# Patient Record
Sex: Female | Born: 1940
Health system: Southern US, Community
[De-identification: ages and names within clinical notes are randomized; demographics above are authoritative.]

## PROBLEM LIST (undated history)

## (undated) DIAGNOSIS — C4491 Basal cell carcinoma of skin, unspecified: Secondary | ICD-10-CM

## (undated) DIAGNOSIS — H269 Unspecified cataract: Secondary | ICD-10-CM

## (undated) DIAGNOSIS — E785 Hyperlipidemia, unspecified: Secondary | ICD-10-CM

## (undated) DIAGNOSIS — S32020A Wedge compression fracture of second lumbar vertebra, initial encounter for closed fracture: Secondary | ICD-10-CM

## (undated) DIAGNOSIS — E039 Hypothyroidism, unspecified: Secondary | ICD-10-CM

## (undated) DIAGNOSIS — E739 Lactose intolerance, unspecified: Secondary | ICD-10-CM

## (undated) DIAGNOSIS — M8580 Other specified disorders of bone density and structure, unspecified site: Secondary | ICD-10-CM

## (undated) HISTORY — DX: Wedge compression fracture of second lumbar vertebra, initial encounter for closed fracture: S32.020A

## (undated) HISTORY — DX: Other specified disorders of bone density and structure, unspecified site: M85.80

## (undated) HISTORY — DX: Hypothyroidism, unspecified: E03.9

## (undated) HISTORY — DX: Hyperlipidemia, unspecified: E78.5

## (undated) HISTORY — DX: Lactose intolerance, unspecified: E73.9

## (undated) HISTORY — DX: Basal cell carcinoma of skin, unspecified: C44.91

## (undated) HISTORY — DX: Unspecified cataract: H26.9

---

## 1986-04-07 HISTORY — PX: BACK SURGERY: SHX140

## 1999-01-28 ENCOUNTER — Encounter: Admission: RE | Admit: 1999-01-28 | Discharge: 1999-01-28 | Payer: Self-pay | Admitting: Obstetrics and Gynecology

## 1999-01-28 ENCOUNTER — Encounter: Payer: Self-pay | Admitting: Obstetrics and Gynecology

## 2000-04-13 ENCOUNTER — Encounter: Admission: RE | Admit: 2000-04-13 | Discharge: 2000-04-13 | Payer: Self-pay | Admitting: Obstetrics and Gynecology

## 2000-04-13 ENCOUNTER — Encounter: Payer: Self-pay | Admitting: Obstetrics and Gynecology

## 2000-04-24 ENCOUNTER — Emergency Department (HOSPITAL_COMMUNITY): Admission: EM | Admit: 2000-04-24 | Discharge: 2000-04-24 | Payer: Self-pay | Admitting: Emergency Medicine

## 2000-04-25 ENCOUNTER — Encounter: Payer: Self-pay | Admitting: Emergency Medicine

## 2000-05-08 ENCOUNTER — Observation Stay (HOSPITAL_COMMUNITY): Admission: RE | Admit: 2000-05-08 | Discharge: 2000-05-09 | Payer: Self-pay | Admitting: *Deleted

## 2000-05-08 ENCOUNTER — Encounter (INDEPENDENT_AMBULATORY_CARE_PROVIDER_SITE_OTHER): Payer: Self-pay | Admitting: Specialist

## 2000-05-08 ENCOUNTER — Encounter: Payer: Self-pay | Admitting: *Deleted

## 2001-07-07 ENCOUNTER — Encounter: Admission: RE | Admit: 2001-07-07 | Discharge: 2001-07-07 | Payer: Self-pay | Admitting: Obstetrics and Gynecology

## 2001-07-07 ENCOUNTER — Encounter: Payer: Self-pay | Admitting: Obstetrics and Gynecology

## 2002-07-21 ENCOUNTER — Encounter: Admission: RE | Admit: 2002-07-21 | Discharge: 2002-07-21 | Payer: Self-pay | Admitting: Obstetrics and Gynecology

## 2002-07-21 ENCOUNTER — Encounter: Payer: Self-pay | Admitting: Obstetrics and Gynecology

## 2003-07-26 ENCOUNTER — Encounter: Admission: RE | Admit: 2003-07-26 | Discharge: 2003-07-26 | Payer: Self-pay | Admitting: Obstetrics and Gynecology

## 2004-09-06 ENCOUNTER — Encounter: Admission: RE | Admit: 2004-09-06 | Discharge: 2004-09-06 | Payer: Self-pay | Admitting: Obstetrics and Gynecology

## 2006-01-23 ENCOUNTER — Encounter: Admission: RE | Admit: 2006-01-23 | Discharge: 2006-01-23 | Payer: Self-pay | Admitting: Obstetrics and Gynecology

## 2006-12-02 LAB — CONVERTED CEMR LAB: Pap Smear: NORMAL

## 2007-02-24 ENCOUNTER — Encounter: Admission: RE | Admit: 2007-02-24 | Discharge: 2007-02-24 | Payer: Self-pay | Admitting: Obstetrics and Gynecology

## 2007-12-09 ENCOUNTER — Ambulatory Visit: Payer: Self-pay | Admitting: Family Medicine

## 2007-12-09 DIAGNOSIS — E039 Hypothyroidism, unspecified: Secondary | ICD-10-CM | POA: Insufficient documentation

## 2007-12-09 DIAGNOSIS — R143 Flatulence: Secondary | ICD-10-CM

## 2007-12-09 DIAGNOSIS — R142 Eructation: Secondary | ICD-10-CM

## 2007-12-09 DIAGNOSIS — R141 Gas pain: Secondary | ICD-10-CM | POA: Insufficient documentation

## 2007-12-09 DIAGNOSIS — M8580 Other specified disorders of bone density and structure, unspecified site: Secondary | ICD-10-CM | POA: Insufficient documentation

## 2007-12-14 LAB — CONVERTED CEMR LAB
ALT: 16 units/L (ref 0–35)
BUN: 14 mg/dL (ref 6–23)
Basophils Relative: 1.3 % (ref 0.0–3.0)
Bilirubin, Direct: 0.1 mg/dL (ref 0.0–0.3)
Cholesterol: 194 mg/dL (ref 0–200)
Creatinine, Ser: 0.8 mg/dL (ref 0.4–1.2)
Eosinophils Absolute: 0.2 10*3/uL (ref 0.0–0.7)
Eosinophils Relative: 3.5 % (ref 0.0–5.0)
HCT: 41.5 % (ref 36.0–46.0)
Hemoglobin: 14.1 g/dL (ref 12.0–15.0)
Lymphocytes Relative: 23.6 % (ref 12.0–46.0)
MCHC: 34.1 g/dL (ref 30.0–36.0)
MCV: 88.9 fL (ref 78.0–100.0)
Potassium: 4.2 meq/L (ref 3.5–5.1)
RBC: 4.67 M/uL (ref 3.87–5.11)
Sodium: 142 meq/L (ref 135–145)
TSH: 1.55 microintl units/mL (ref 0.35–5.50)
Total CHOL/HDL Ratio: 6.2
Total Protein: 7.9 g/dL (ref 6.0–8.3)
Triglycerides: 91 mg/dL (ref 0–149)
VLDL: 18 mg/dL (ref 0–40)
WBC: 6.1 10*3/uL (ref 4.5–10.5)

## 2007-12-30 ENCOUNTER — Ambulatory Visit: Payer: Self-pay | Admitting: Family Medicine

## 2008-01-14 ENCOUNTER — Ambulatory Visit: Payer: Self-pay | Admitting: Internal Medicine

## 2008-01-28 ENCOUNTER — Ambulatory Visit: Payer: Self-pay | Admitting: Internal Medicine

## 2008-01-28 LAB — HM COLONOSCOPY

## 2008-02-25 ENCOUNTER — Encounter: Admission: RE | Admit: 2008-02-25 | Discharge: 2008-02-25 | Payer: Self-pay | Admitting: Family Medicine

## 2008-02-25 ENCOUNTER — Encounter: Payer: Self-pay | Admitting: Family Medicine

## 2008-03-09 ENCOUNTER — Ambulatory Visit: Payer: Self-pay | Admitting: Family Medicine

## 2008-03-09 DIAGNOSIS — E785 Hyperlipidemia, unspecified: Secondary | ICD-10-CM | POA: Insufficient documentation

## 2008-03-10 LAB — CONVERTED CEMR LAB
AST: 22 units/L (ref 0–37)
Cholesterol: 198 mg/dL (ref 0–200)
HDL: 39.9 mg/dL (ref >39.0)
LDL Cholesterol: 145 mg/dL — ABNORMAL HIGH (ref 0–99)
VLDL: 14 mg/dL (ref 0–40)

## 2009-02-27 IMAGING — MG MM SCREEN MAMMOGRAM BILATERAL
4 series · 4 of 4 positions shown · non-contrast
Comparison: none

DG SCREEN MAMMOGRAM BILATERAL
Bilateral CC and MLO view(s) were taken.

DIGITAL SCREENING MAMMOGRAM WITH CAD:
There are scattered fibroglandular densities.  No masses or malignant type calcifications are 
identified.  Compared with prior studies.

[R CC]
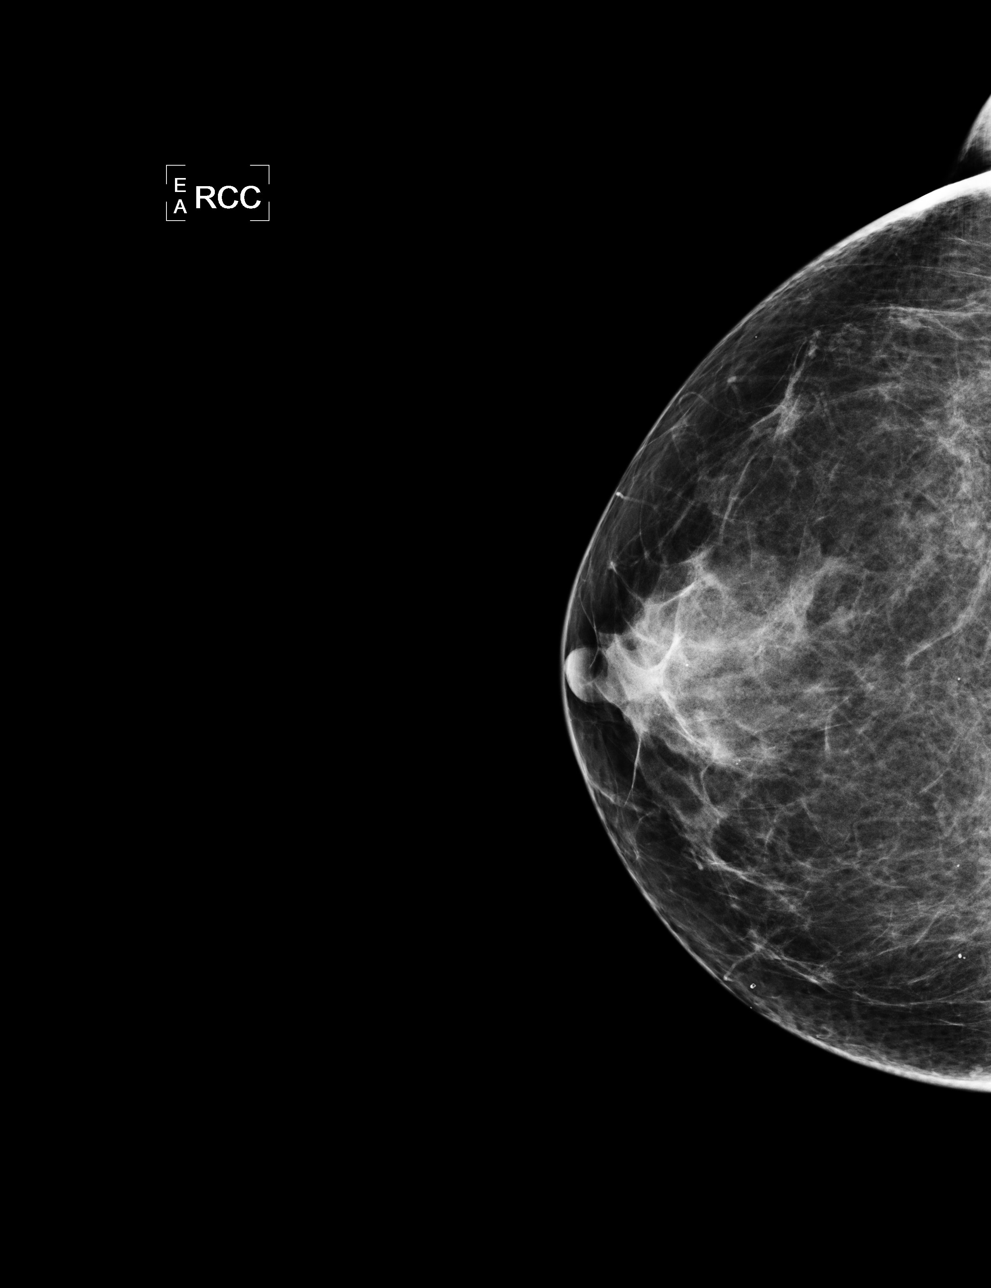

[L CC]
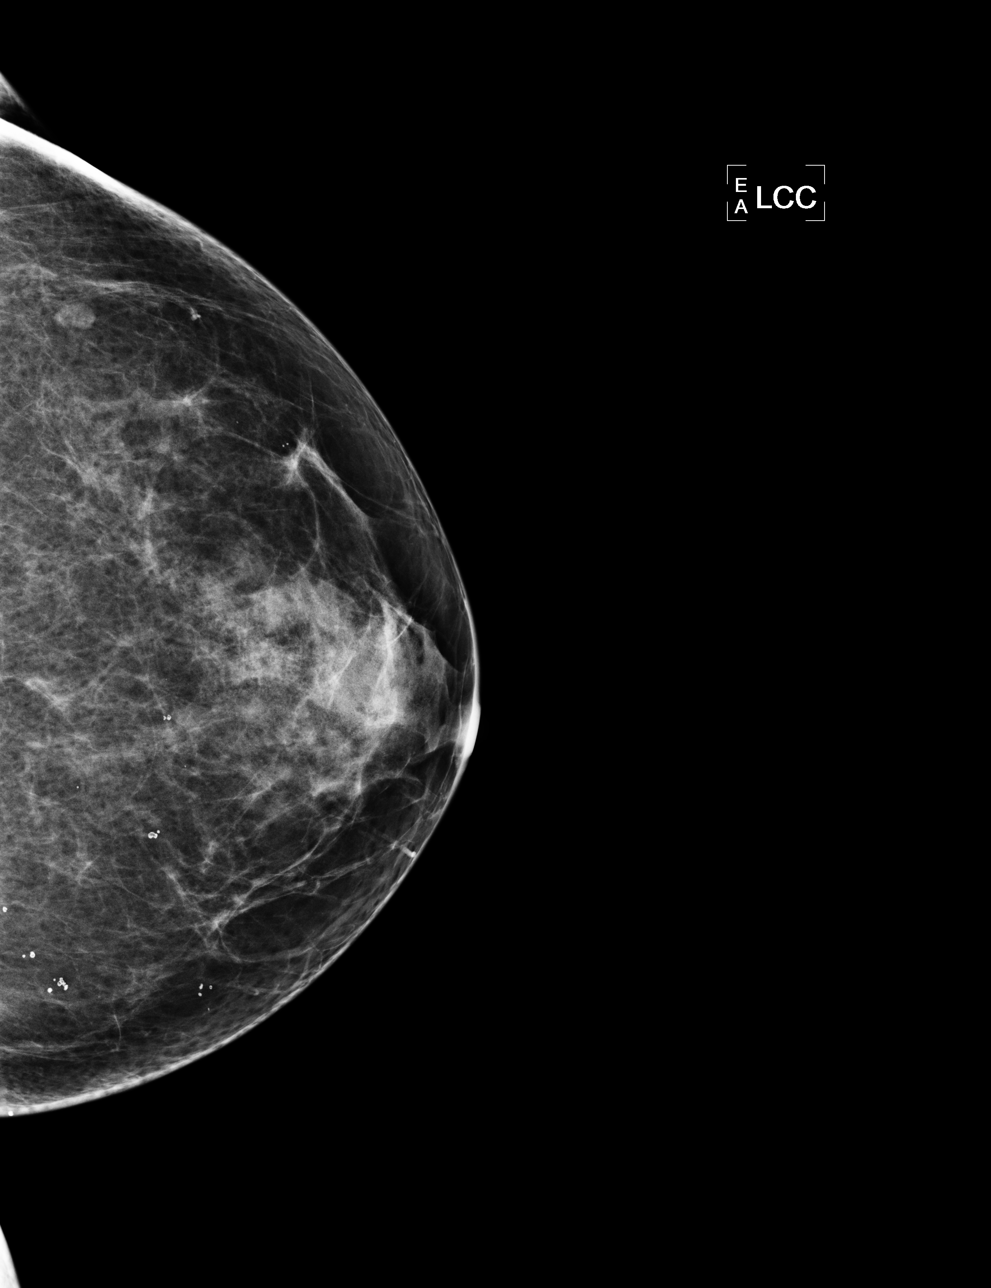

[L MLO]
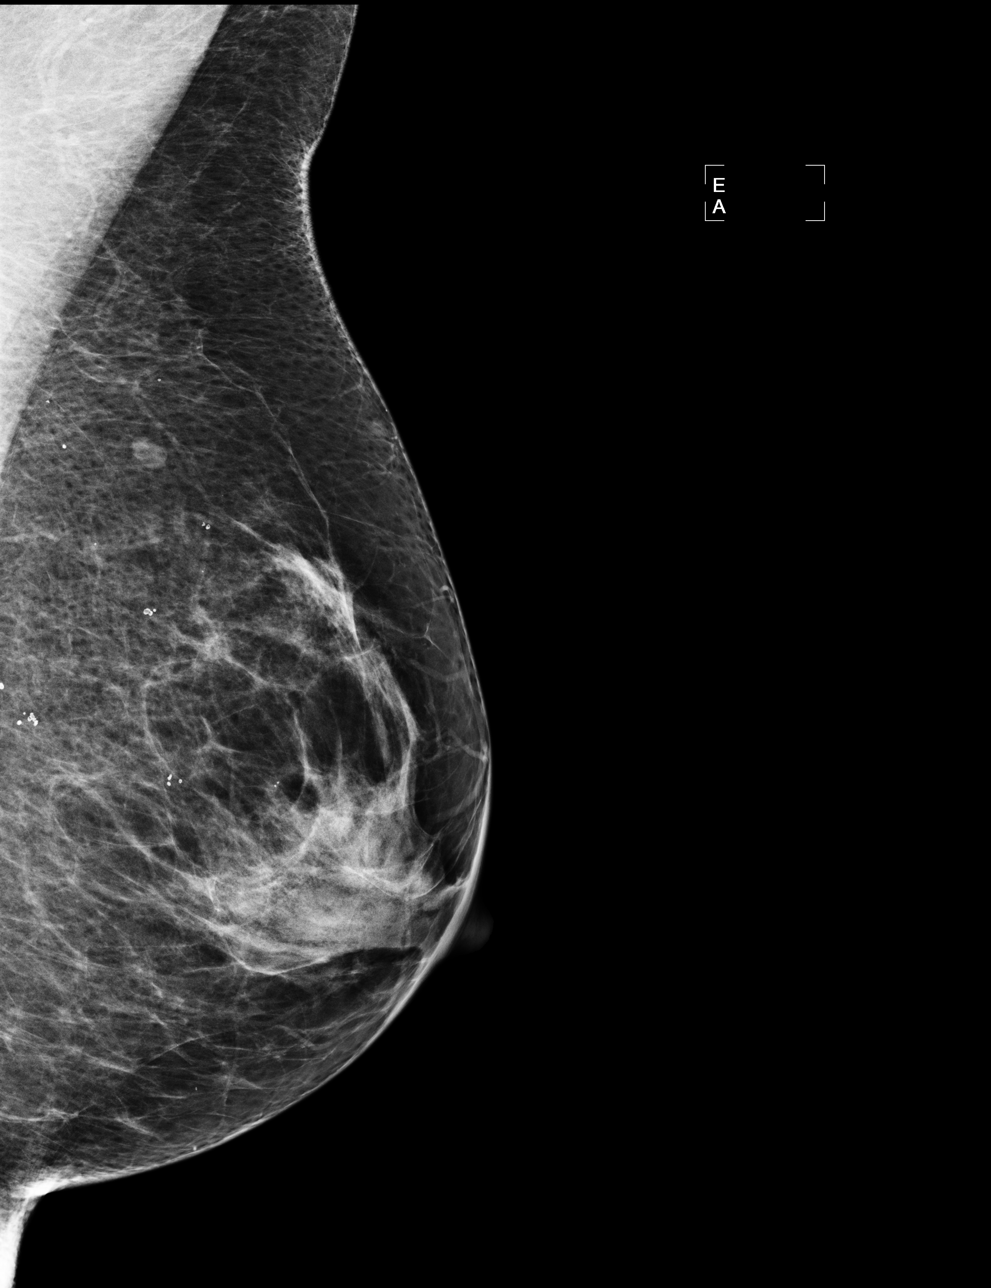

[R MLO]
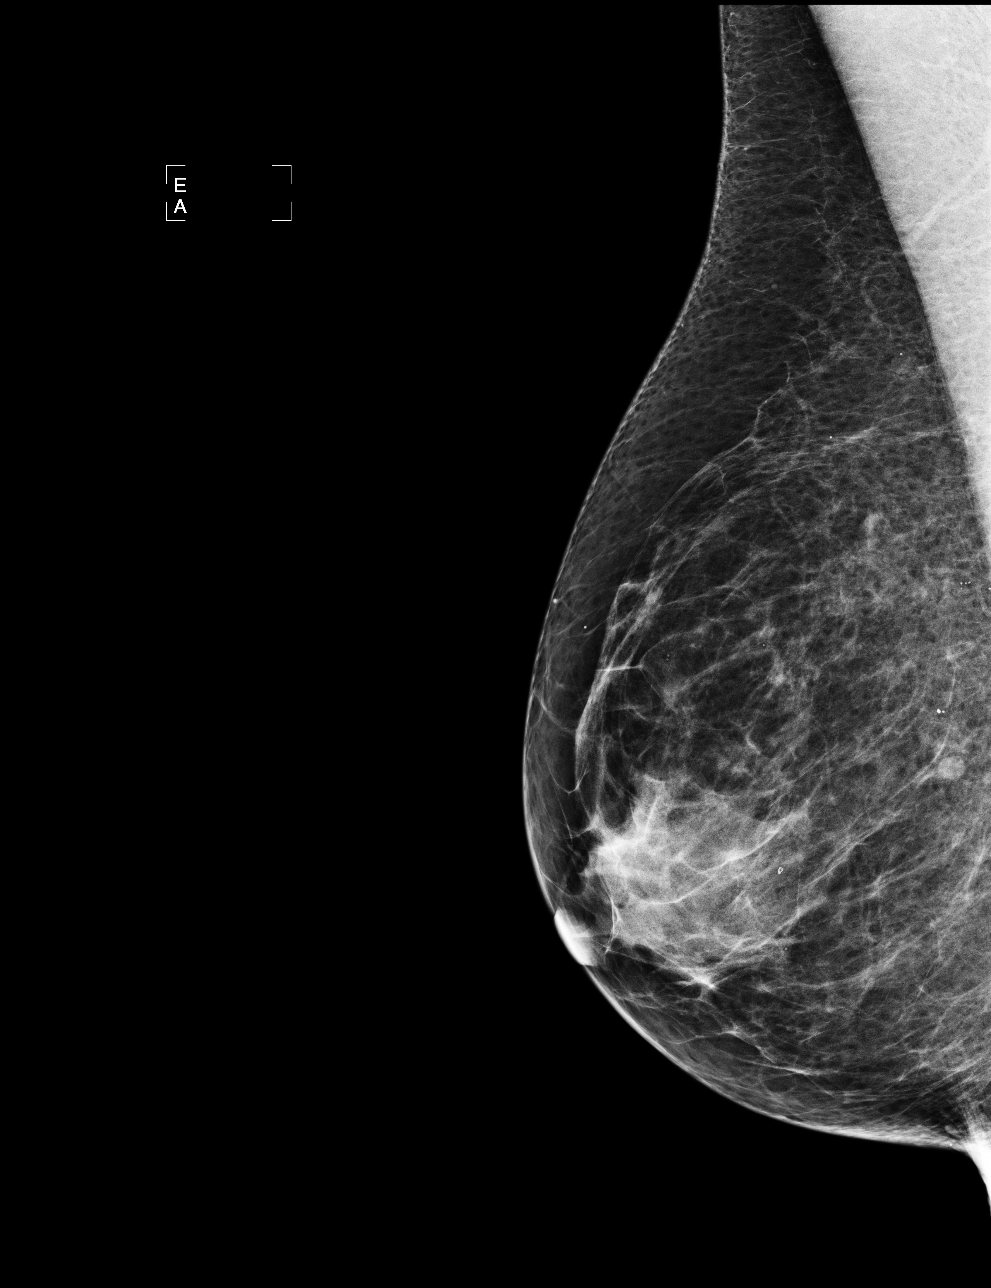

[4 of 4 positions shown; findings below may reference images not displayed]

IMPRESSION: No specific mammographic evidence of malignancy.  Next screening mammogram is recommended in one 
year.

ASSESSMENT: Negative - BI-RADS 1

Screening mammogram in 1 year.
ANALYZED BY COMPUTER AIDED DETECTION. , THIS PROCEDURE WAS A DIGITAL MAMMOGRAM.

## 2009-03-08 ENCOUNTER — Encounter: Admission: RE | Admit: 2009-03-08 | Discharge: 2009-03-08 | Payer: Self-pay | Admitting: Family Medicine

## 2009-03-12 ENCOUNTER — Encounter (INDEPENDENT_AMBULATORY_CARE_PROVIDER_SITE_OTHER): Payer: Self-pay | Admitting: *Deleted

## 2009-04-12 ENCOUNTER — Telehealth: Payer: Self-pay | Admitting: Family Medicine

## 2009-07-18 ENCOUNTER — Ambulatory Visit: Payer: Self-pay | Admitting: Family Medicine

## 2009-07-18 ENCOUNTER — Other Ambulatory Visit: Admission: RE | Admit: 2009-07-18 | Discharge: 2009-07-18 | Payer: Self-pay | Admitting: Family Medicine

## 2009-07-18 DIAGNOSIS — L708 Other acne: Secondary | ICD-10-CM | POA: Insufficient documentation

## 2009-07-18 LAB — HM PAP SMEAR

## 2009-07-19 LAB — CONVERTED CEMR LAB: Vit D, 25-Hydroxy: 40 ng/mL (ref 30–89)

## 2009-07-20 ENCOUNTER — Telehealth: Payer: Self-pay | Admitting: Family Medicine

## 2009-07-24 LAB — CONVERTED CEMR LAB
Albumin: 4.1 g/dL (ref 3.5–5.2)
CO2: 30 meq/L (ref 19–32)
Chloride: 100 meq/L (ref 96–112)
Cholesterol: 235 mg/dL — ABNORMAL HIGH (ref 0–200)
Direct LDL: 173.3 mg/dL
HDL: 46.8 mg/dL (ref >39.00)
Sodium: 139 meq/L (ref 135–145)
TSH: 1.54 microintl units/mL (ref 0.35–5.50)
Total CHOL/HDL Ratio: 5
Triglycerides: 106 mg/dL (ref 0.0–149.0)

## 2009-07-26 ENCOUNTER — Encounter (INDEPENDENT_AMBULATORY_CARE_PROVIDER_SITE_OTHER): Payer: Self-pay | Admitting: *Deleted

## 2009-10-19 ENCOUNTER — Ambulatory Visit: Payer: Self-pay | Admitting: Family Medicine

## 2009-10-21 LAB — CONVERTED CEMR LAB
AST: 22 units/L (ref 0–37)
Direct LDL: 137.6 mg/dL
VLDL: 22.6 mg/dL (ref 0.0–40.0)

## 2009-10-26 ENCOUNTER — Ambulatory Visit: Payer: Self-pay | Admitting: Family Medicine

## 2010-04-26 ENCOUNTER — Encounter
Admission: RE | Admit: 2010-04-26 | Discharge: 2010-04-26 | Payer: Self-pay | Source: Home / Self Care | Attending: Family Medicine | Admitting: Family Medicine

## 2010-04-26 LAB — HM MAMMOGRAPHY: HM Mammogram: NORMAL

## 2010-04-30 ENCOUNTER — Encounter (INDEPENDENT_AMBULATORY_CARE_PROVIDER_SITE_OTHER): Payer: Self-pay | Admitting: *Deleted

## 2010-04-30 ENCOUNTER — Encounter: Payer: Self-pay | Admitting: Family Medicine

## 2010-05-09 NOTE — Progress Notes (Signed)
Summary: Rx Levothroid  Phone Note Outgoing Call Call back at [phone]   Call placed by: Linde Gillis CMA [facility]),  April 12, 2009 11:16 AM Call placed to: Pharmacy Summary of Call: Called in a refill for Levothroid , #90 with no additional refills.  Advised pharmacist that patient would need to schedule an appointment and have lab work done before any additional refills will be authorized. Initial call taken by: Linde Gillis CMA [facility]),  April 12, 2009 11:17 AM

## 2010-05-09 NOTE — Miscellaneous (Signed)
Summary: Mammogram to flowsheet  Clinical Lists Changes  Observations: Added new observation of MAMMO DUE: 05/2011 (04/30/2010 9:33) Added new observation of MAMMOGRAM: normal (04/26/2010 9:33)      Preventive Care Screening  Mammogram:    Date:  04/26/2010    Next Due:  05/2011    Results:  normal

## 2010-05-09 NOTE — Assessment & Plan Note (Signed)
Summary: PAP SMEAR AND CPX/CLE   Vital Signs:  Patient profile:   70 year old female Height:      63.25 inches Weight:      150 pounds BMI:     26.46 Temp:     98.2 degrees F oral Pulse rate:   80 / minute Pulse rhythm:   regular BP sitting:   112 / 70  (left arm) Cuff size:   regular  Vitals Entered By: Lewanda Rife LPN (July 18, 2009 10:04 AM) CC: check up    History of Present Illness: here for f/u of chronic health problems and to review health mt list  has had a good year no complaints  is taking good care of herself   is interested in going to a gym  perhaps the silver sneakers -- but cannot afford the gas to go  now is walking at least a mile at lunch    wt is up 10 lb is trying the best to loose wt and eat healthy watching carbs and cutting down on portions -- and more fiber   bp is 112/ 70  hypothyroid - due for check  has gained some wt - but feels about the same   is working 8 hours per day   lipids - due diet - is better- got away from fried foods   ostopenia 11/09  is taking her ca and vit  d- has helped her sleep   colonosc nl 09   pap 08 -- has been 3 years no gyn problems   mam 12/10  self exam - no lumps   Td 09   never had shingles vaccine andnot interested in  declines flu and pneumovax   has black head on back cannot get out - bothers her  some SKs     Allergies: 1)  ! 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 Eyes:  Denies blurring and eye irritation. CV:  Denies chest pain or discomfort, lightheadness, palpitations, and shortness of breath with exertion. Resp:  Denies cough, shortness of breath, and wheezing. GI:  Denies abdominal pain, bloody stools, change in bowel habits, indigestion, and nausea. GU:  Denies urinary frequency. MS:  Denies joint pain, joint redness, joint swelling, muscle aches, and muscle weakness. Derm:  Denies itching, lesion(s), poor wound healing, and rash. Neuro:  Denies numbness, tingling, and weakness. Psych:  mood is ok . Endo:  Denies cold intolerance, excessive thirst, excessive urination, and heat intolerance. Heme:  Denies abnormal bruising and bleeding.  Physical Exam  General:  Well-developed,well-nourished,in no acute distress; alert,appropriate and cooperative throughout examination Head:  normocephalic, atraumatic, and no abnormalities observed.   Eyes:  vision grossly intact, pupils equal, pupils round, and pupils reactive to light.  no conjunctival pallor, injection or icterus  Ears:  R ear normal and L ear normal.   Nose:  no nasal  discharge.   Mouth:  pharynx pink and moist.   Neck:  supple with full rom and no masses or thyromegally, no JVD or carotid bruit  Chest Wall:  No deformities, masses, or tenderness noted. Breasts:  No mass, nodules, thickening, tenderness, bulging, retraction, inflamation, nipple discharge or skin changes noted.   Lungs:  Normal respiratory effort, chest expands symmetrically. Lungs are clear to auscultation, no crackles or wheezes. Heart:  Normal rate and regular rhythm. S1 and S2 normal without gallop, murmur, click, rub or other extra sounds. Abdomen:  Bowel sounds positive,abdomen soft and non-tender without masses, organomegaly or hernias noted. no renal bruits  Genitalia:  Normal introitus for age, no external lesions, no  vaginal discharge, mucosa pink and moist, no vaginal or cervical lesions, no vaginal atrophy, no friaility or hemorrhage, normal uterus size and position, no adnexal masses or tenderness Msk:  No deformity or scoliosis noted of thoracic or lumbar spine.  no acute joint changes  Pulses:  R and L carotid,radial,femoral,dorsalis pedis and posterior tibial pulses are full and equal bilaterally Extremities:  No clubbing, cyanosis, edema, or deformity noted with normal full range of motion of all joints.   Neurologic:  sensation intact to light touch, gait normal, and DTRs symmetrical and normal.   Skin:  Intact without suspicious lesions or rashes comedone R back .5 cm / dark  area pierced sterilly and seb material expressed with relief  some SKs and lentigos  Cervical Nodes:  No lymphadenopathy noted Axillary Nodes:  No palpable lymphadenopathy Inguinal Nodes:  No significant adenopathy Psych:  normal affect, talkative and pleasant    Impression & Recommendations:  Problem # 1:  PURE HYPERCHOLESTEROLEMIA (ICD-272.0) Assessment Unchanged  check lipids  disc low sat fat diet  consider statin if worse on good diet - disc this , pt wants to avoid if poss Orders: Venipuncture (04540) TLB-Lipid Panel (80061-LIPID) TLB-Renal Function Panel (80069-RENAL) TLB-ALT (SGPT) (84460-ALT) TLB-AST (SGOT) (84450-SGOT)  Labs Reviewed: SGOT: 22 (03/09/2008)   SGPT: 16 (03/09/2008)   HDL:39.9 (03/09/2008), 31.4 (12/09/2007)  LDL:145 (03/09/2008), 144 (12/09/2007)  Chol:198 (03/09/2008), 194 (12/09/2007)  Trig:68 (03/09/2008), 91 (12/09/2007)  Problem # 2:  OSTEOPENIA (ICD-733.90) Assessment: Unchanged dexa within 2 y  rev ca and vit D check D level disc inc wt bearing exercise  The following medications were removed from the medication list:    Calcium 600 Mg Tabs (Calcium) .Marland Kitchen... Take one by mouth twice a day Her updated medication list for this problem includes:    Vitamin D 1000 Unit Tabs  (Cholecalciferol) .Marland Kitchen... Daily    Calcium-vitamin D3 600-200 Mg-unit Tabs (Calcium carbonate-vitamin d) .Marland Kitchen... Take one tablet by mouth twice a day  Orders: Venipuncture (98119) TLB-Lipid Panel (80061-LIPID) TLB-Renal Function Panel (80069-RENAL) TLB-ALT (SGPT) (84460-ALT) TLB-AST (SGOT) (84450-SGOT) T-Vitamin D (25-Hydroxy) (14782-95621)  Problem # 3:  HYPOTHYROIDISM (ICD-244.9) Assessment: Unchanged  with no clinical changes but wt gain tsh and update  Her updated medication list for this problem includes:    Synthroid 75 Mcg Tabs (Levothyroxine sodium) .Marland Kitchen... Take one by mouth daily  Orders: Venipuncture (30865) TLB-Lipid Panel (80061-LIPID) TLB-Renal Function Panel (80069-RENAL) TLB-ALT (SGPT) (84460-ALT) TLB-AST (SGOT) (84450-SGOT) TLB-TSH (Thyroid Stimulating Hormone) (84443-TSH)  Labs Reviewed: TSH: 1.55 (12/09/2007)    Chol: 198 (03/09/2008)   HDL: 39.9 (03/09/2008)   LDL: 145 (03/09/2008)   TG: 68 (03/09/2008)  Problem # 4:  ROUTINE GYNECOLOGICAL EXAMINATION (ICD-V72.31) annual exam mam up to date   Problem # 5:  COMEDO (ICD-706.1) Assessment: New large comedo  on R back pierced sterilly and expressed with relief adv to update if pain or redness   Complete Medication List: 1)  Synthroid 75 Mcg Tabs (Levothyroxine sodium) .... Take one by mouth daily 2)  Vitamin E 400 Unit Caps (Vitamin e) .... Take one by mouth daily 3)  Gelatin 10 Grains  .... Take one by mouth twice a day 4)  Fish Oil 1200 Mg Caps (Omega-3 fatty acids) .... Daily 5)  Vitamin D 1000 Unit Tabs (Cholecalciferol) .... Daily 6)  Calcium-vitamin D3 600-200 Mg-unit Tabs (Calcium carbonate-vitamin d) .... Take one tablet by mouth twice a day  Patient Instructions: 1)  keep up the good work with healthy diet and exercise  2)  labs today including vit D level and cholesterol  3)  keep area on back clean and dry - let  me know if red or drainage  Prescriptions: SYNTHROID 75 MCG TABS (LEVOTHYROXINE  SODIUM) take one by mouth daily  #90 x 3   Entered and Authorized by:   Judith Part MD   Signed by:   Judith Part MD on 07/18/2009   Method used:   Print then Give to Patient   RxID:   [id number]   Current Allergies (reviewed today): ! DARVOCET   Preventive Care Screening  Contraindications of Treatment or Deferment of Test/Procedure:    Test/Procedure: FLU VAX    Reason for deferment: patient declined     Test/Procedure: Pneumovax vaccine    Reason for deferment: patient declined     pt declines zoster vaccine and flu and pneumovax

## 2010-05-09 NOTE — Letter (Signed)
Summary: Results Follow up Letter  [place] [facility]  [address] [place], [place] [address]   Phone: [phone]  Fax: [phone]    07/26/2009 MRN: [id number]    [facility] [address] [facility] [address] [place], [place]  [address]    Dear Valerie Ho,  The following are the results of your recent test(s):  Test         Result    Pap Smear:        Normal __X___  Not Normal _____ Comments: ______________________________________________________ Cholesterol: LDL(Bad cholesterol):         Your goal is less than:         HDL (Good cholesterol):       Your goal is more than: Comments:  ______________________________________________________ Mammogram:        Normal _____  Not Normal _____ Comments:  ___________________________________________________________________ Hemoccult:        Normal _____  Not normal _______ Comments:    _____________________________________________________________________ Other Tests:    We routinely do not discuss normal results over the telephone.  If you desire a copy of the results, or you have any questions about this information we can discuss them at your next office visit.   Sincerely,    Marne A. Milinda Antis, M.D.  MAT:lsf

## 2010-05-09 NOTE — Letter (Signed)
Summary: Results Follow up Letter  [name] [facility]  [address] [place], [place] [address]   Phone: [phone]  Fax: [phone]    04/30/2010 MRN: [id number]  [facility] [address] [facility] [address] [place], [place]  [address]  Dear Valerie Ho,  The following are the results of your recent test(s):  Test         Result    Pap Smear:        Normal _____  Not Normal _____ Comments: ______________________________________________________ Cholesterol: LDL(Bad cholesterol):         Your goal is less than:         HDL (Good cholesterol):       Your goal is more than: Comments:  ______________________________________________________ Mammogram:        Normal __X___  Not Normal _____ Comments: Repeat in 1 year  ___________________________________________________________________ Hemoccult:        Normal _____  Not normal _______ Comments:    _____________________________________________________________________ Other Tests:    We routinely do not discuss normal results over the telephone.  If you desire a copy of the results, or you have any questions about this information we can discuss them at your next office visit.   Sincerely,       Sharilyn Sites for Dr. Roxy Manns

## 2010-05-09 NOTE — Progress Notes (Signed)
Summary: Synthroid rx  Phone Note From Pharmacy Call back at [phone]   Caller: [facility] Call For: Dr Manessa Buley  Summary of Call: completed form and Synthroid rx take one by mouth daily #90 with 3 refills faxed to [phone]. form is at my desk if needed later.Lewanda Rife LPN  July 20, 2009 6:17 PM  Initial call taken by: Lewanda Rife LPN,  July 20, 2009 6:17 PM

## 2010-05-09 NOTE — Assessment & Plan Note (Signed)
Summary: FOLLOW UP AFTER LABS/RI   Vital Signs:  Patient profile:   70 year old female Height:      63.25 inches Weight:      143.75 pounds BMI:     25.35 Temp:     98 degrees F oral Pulse rate:   76 / minute Pulse rhythm:   regular BP sitting:   128 / 74  (left arm) Cuff size:   regular  Vitals Entered By: Lewanda Rife LPN (October 26, 2009 8:04 AM) CC: follow-up visit after labs   History of Present Illness: here for f/u of high lipids   wt is down 7 lb  chol is much improved with LDL coming down from 173 to 137 trig 113, and HDL 36  diet - is watching to not eat red meat no fried foods/ no egg yolks or shrimp - except occ fried fish   exercise has helped the most -- going to [employer] and doing zumba on M and TH   is really proud of herself   Allergies: 1)  ! 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 Eyes:  Denies blurring and eye irritation. CV:  Denies chest pain or discomfort, lightheadness, and palpitations. Resp:  Denies cough and shortness of breath. GI:  Denies abdominal pain, change in bowel habits, nausea, and vomiting. MS:  Denies muscle aches and cramps. Derm:  Denies itching, lesion(s), poor wound healing, and rash. Neuro:  Denies numbness and tingling. Heme:  Denies abnormal bruising and bleeding.  Physical Exam  General:  Well-developed,well-nourished,in  no acute distress; alert,appropriate and cooperative throughout examination wt loss noted Head:  normocephalic, atraumatic, and no abnormalities observed.   Mouth:  pharynx pink and moist.   Neck:  supple with full rom and no masses or thyromegally, no JVD or carotid bruit  Lungs:  Normal respiratory effort, chest expands symmetrically. Lungs are clear to auscultation, no crackles or wheezes. Heart:  Normal rate and regular rhythm. S1 and S2 normal without gallop, murmur, click, rub or other extra sounds. Pulses:  R and L carotid,radial,femoral,dorsalis pedis and posterior tibial pulses are full and equal bilaterally Extremities:  No clubbing, cyanosis, edema, or deformity noted with normal full range of motion of all joints.   Neurologic:  sensation intact to light touch, gait normal, and DTRs symmetrical and normal.   Skin:  Intact without suspicious lesions or rashes Cervical Nodes:  No lymphadenopathy noted Psych:  normal affect, talkative and pleasant    Impression & Recommendations:  Problem # 1:  PURE HYPERCHOLESTEROLEMIA (ICD-272.0) Assessment Improved  much improved with better diet and more exercise rev labs in detail goal LDL is 130 or below- will keep working on it  disc low sat fat diet in detail  Labs Reviewed: SGOT: 22 (10/19/2009)   SGPT: 15 (10/19/2009)   HDL:36.10 (10/19/2009), 46.80 (07/18/2009)  LDL:145 (03/09/2008), 144 (12/09/2007)  Chol:208 (10/19/2009),  235 (07/18/2009)  Trig:113.0 (10/19/2009), 106.0 (07/18/2009)  Complete Medication List: 1)  Synthroid 75 Mcg Tabs (Levothyroxine sodium) .... Take one by mouth daily 2)  Vitamin E 400 Unit Caps (Vitamin e) .... Take one by mouth daily 3)  Gelatin 10 Grains  .... Take two by mouth twice a day 4)  Fish Oil 1200 Mg Caps (Omega-3 fatty acids) .... Two capsules once daily 5)  Vitamin D 1000 Unit Tabs (Cholecalciferol) .... Daily 6)  Calcium-vitamin D3 600-200 Mg-unit Tabs (Calcium carbonate-vitamin d) .... Take  one tablet by mouth twice a day  Patient Instructions: 1)  cholesterol is much improved  2)  is almost at goal 3)  keep working on diet and exercise   Current Allergies (reviewed today): ! DARVOCET

## 2010-08-03 ENCOUNTER — Encounter: Payer: Self-pay | Admitting: Family Medicine

## 2010-08-19 ENCOUNTER — Telehealth (INDEPENDENT_AMBULATORY_CARE_PROVIDER_SITE_OTHER): Payer: MEDICARE | Admitting: Family Medicine

## 2010-08-19 DIAGNOSIS — M899 Disorder of bone, unspecified: Secondary | ICD-10-CM

## 2010-08-19 DIAGNOSIS — M949 Disorder of cartilage, unspecified: Secondary | ICD-10-CM

## 2010-08-19 DIAGNOSIS — E039 Hypothyroidism, unspecified: Secondary | ICD-10-CM

## 2010-08-19 DIAGNOSIS — E7800 Pure hypercholesterolemia, unspecified: Secondary | ICD-10-CM

## 2010-08-19 NOTE — Telephone Encounter (Signed)
Message copied by Roxy Manns on Mon Aug 19, 2010  9:03 PM ------      Message from: Melody Comas      Created: Mon Aug 19, 2010  8:54 AM      Regarding: Lab orders       Patient is coming in for cpx labs tomorrow. Please put orders in. Thanks.

## 2010-08-20 ENCOUNTER — Other Ambulatory Visit (INDEPENDENT_AMBULATORY_CARE_PROVIDER_SITE_OTHER): Payer: MEDICARE | Admitting: Family Medicine

## 2010-08-20 DIAGNOSIS — M949 Disorder of cartilage, unspecified: Secondary | ICD-10-CM

## 2010-08-20 DIAGNOSIS — M899 Disorder of bone, unspecified: Secondary | ICD-10-CM

## 2010-08-20 DIAGNOSIS — E7800 Pure hypercholesterolemia, unspecified: Secondary | ICD-10-CM

## 2010-08-20 LAB — LIPID PANEL
Cholesterol: 193 mg/dL (ref 0–200)
HDL: 38.2 mg/dL — ABNORMAL LOW (ref >39.00)
LDL Cholesterol: 135 mg/dL — ABNORMAL HIGH (ref 0–99)
Triglycerides: 99 mg/dL (ref 0.0–149.0)

## 2010-08-20 LAB — COMPREHENSIVE METABOLIC PANEL
CO2: 28 mEq/L (ref 19–32)
Calcium: 8.8 mg/dL (ref 8.4–10.5)
Creatinine, Ser: 0.8 mg/dL (ref 0.4–1.2)
GFR: 74.31 mL/min (ref >60.00)
Potassium: 4.3 mEq/L (ref 3.5–5.1)
Sodium: 140 mEq/L (ref 135–145)
Total Bilirubin: 0.4 mg/dL (ref 0.3–1.2)
Total Protein: 6.4 g/dL (ref 6.0–8.3)

## 2010-08-21 ENCOUNTER — Other Ambulatory Visit: Payer: Self-pay | Admitting: *Deleted

## 2010-08-21 MED ORDER — LEVOTHYROXINE SODIUM 75 MCG PO TABS: ORAL_TABLET | ORAL | Status: DC

## 2010-08-21 NOTE — Telephone Encounter (Signed)
Called to [facility].

## 2010-08-23 ENCOUNTER — Ambulatory Visit (INDEPENDENT_AMBULATORY_CARE_PROVIDER_SITE_OTHER): Payer: Medicare Other | Admitting: Family Medicine

## 2010-08-23 ENCOUNTER — Encounter: Payer: Self-pay | Admitting: Family Medicine

## 2010-08-23 VITALS — BP 114/66 | HR 88 | Temp 97.9°F | Ht 63.25 in | Wt 152.0 lb

## 2010-08-23 DIAGNOSIS — E7800 Pure hypercholesterolemia, unspecified: Secondary | ICD-10-CM

## 2010-08-23 DIAGNOSIS — Z780 Asymptomatic menopausal state: Secondary | ICD-10-CM | POA: Insufficient documentation

## 2010-08-23 DIAGNOSIS — M899 Disorder of bone, unspecified: Secondary | ICD-10-CM

## 2010-08-23 DIAGNOSIS — E039 Hypothyroidism, unspecified: Secondary | ICD-10-CM

## 2010-08-23 DIAGNOSIS — M949 Disorder of cartilage, unspecified: Secondary | ICD-10-CM

## 2010-08-23 MED ORDER — LEVOTHYROXINE SODIUM 75 MCG PO TABS: ORAL_TABLET | ORAL | Status: DC

## 2010-08-23 NOTE — Assessment & Plan Note (Signed)
It is time for dexa for osteopenia  Rev ca and D D level is good  Enc to keep up the wt bearing exercise

## 2010-08-23 NOTE — Assessment & Plan Note (Signed)
This continues to improve with better diet and exercise  Rev lab -- LDL 135-- almost at goal of 130 or below Rev low sat fat diet  Will keep working on it

## 2010-08-23 NOTE — Op Note (Signed)
[facility]  Patient:    Valerie Ho, Valerie Ho                       MRN: [id number] Proc. Date: 05/08/00 Adm. Date:  [id number] Disc. Date: [id number] Attending:  Kandis Mannan CC:         Katherine Roan, M.D.   Operative Report  CCS NUMBER:  [id number]  PREOPERATIVE DIAGNOSIS:  Chronic cholecystitis with cholelithiasis.  POSTOPERATIVE DIAGNOSIS:  Chronic cholecystitis with cholelithiasis.  OPERATION:  Laparoscopic cholecystectomy with operative cholangiogram.  SURGEON:  Maisie Fus B. Samuella Cota, M.D.  ASSISTANT:  Zigmund Daniel, M.D.  ANESTHESIA:  General.  ANESTHESIOLOGIST:  CRNA  DESCRIPTION OF PROCEDURE:  The patient was taken to the operating room and placed on the table in the supine position.  After satisfactory anesthetic with intubation, the entire abdomen was prepped and draped in the sterile field.  A small vertical infraumbilical incision was made through the skin and subcutaneous tissue and midline fascia.  A finger was placed into the peritoneal cavity and there were no adhesions around the umbilicus.  A pursestring suture of O Vicryl was placed and the Hasson trocar placed into the abdomen and the abdomen insufflated to 14 mmHg.  A second 10 mm trocar was placed just to the right of midline in the subxiphoid area.  Two 5 mm trocars were placed laterally.  Exploration revealed that the patient had a large distended gallbladder.  The liver and stomach appeared normal.  The gallbladder was placed on traction, and the cystic duct was dissected free. The cystic duct seemed fairly large and initially seemed somewhat enlarged. The cystic artery and the cystic artery lymph node were also noted.  Because the patient had some elevated liver function tests recently it was elected to do an operative cholangiogram.  The cystic duct was clipped on the gallbladder side a small opening made into the cystic duct.  The bile appeared normal. The [facility] was introduced through a stab wound in the right upper quadrant of the abdomen and the catheter was placed into the cystic duct and help with an Endoclip.  Using real time C-arm fluoroscopy, x-rays were carried out which revealed filling of the biliary system with free flow of contrast into the small bowel.  After viewing the films, the cholangiocatheter was removed and the cystic duct was triply clipped and divided.  The cystic artery was then dissected free and was triply clipped on the remaining side, once on the gallbladder side and divided.  The gallbladder was dissected from the bed.  Bleeding was controlled.  There was no evidence of any bleeding or a bile leak at the time of this dissection.  The gallbladder was then brought up to the infraumbilical incision where it was opened and numerous stones were removed and then the gallbladder with remaining stones was removed.  The pursestring suture of 0 Vicryl was tied to close the fascia.  The right upper quadrant of the abdomen was irrigated.  The irrigant was clear with no evidence of bile or blood.  The two lateral trocars were removed under direct vision.  There was no bleeding.  The abdomen was deflated.  The two midline incisions were closed with running subcuticular sutures of 4-0 Vicryl and the two lateral trocars were closed with single supple subcuticular 4-0 Vicryl. Benzoin and 0.25 inch Steri-Strips were used to reinforce the skin closure. Then 0.25% Marcaine without epinephrine had been injected at all  trocar sites.  The patient seemed to tolerate the procedure well and was taken to the PACU in satisfactory condition. DD:  05/08/00 TD:  05/10/00 Job: [id number]

## 2010-08-23 NOTE — Patient Instructions (Signed)
We will schedule bone density test at check out  Cholesterol is improving -keep working on diet and exercise  You can try antifungal cream or powder - like lamasil or lotrimin -- for itching

## 2010-08-23 NOTE — Progress Notes (Signed)
Subjective:    Patient ID: Valerie Ho, female    DOB: 09/19/1940, 70 y.o.   MRN: [id number]  HPI Here for check up of chronic medical problems and to rev health mt list  Is feeling good overall   2-3 weeks ago had episode of head "tightness" and felt a throbbing in it  ? If weather related  Has not happened since  Has had tinnitis for years   Zoster status Has never had shingles vaccine - does not want it   Pneumovax -- declines that too   Flu shots declines those because "she does not get sick" Td 9/09   Mam 1/12-- was normal  No lumps or changes on self exam  colonosc 09 -- tics, told 10 year f/u   Pap 4/11 No problems gyn at all No new partners   dexa 4/09 Osteopenia  On ca and vit D  Will go ahead schedule dexa   Hypothyroid  Lab Results  Component Value Date   TSH 2.05 08/20/2010   clinically-- feels fine- does not feel different    Lipids continue to imrpove with LDL 135 down from 145 Is really watching the fats in her diet- getting better and better about that  Lab Results  Component Value Date   CHOL 193 08/20/2010   CHOL 208* 10/19/2009   CHOL 235* 07/18/2009   Lab Results  Component Value Date   HDL 38.20* 08/20/2010   HDL 36.10* 10/19/2009   HDL 46.80 07/18/2009   Lab Results  Component Value Date   LDLCALC 135* 08/20/2010   LDLCALC 145* 03/09/2008   LDLCALC 144* 12/09/2007   Lab Results  Component Value Date   TRIG 99.0 08/20/2010   TRIG 113.0 10/19/2009   TRIG 106.0 07/18/2009   Lab Results  Component Value Date   CHOLHDL 5 08/20/2010   CHOLHDL 6 10/19/2009   CHOLHDL 5 07/18/2009   Lab Results  Component Value Date   LDLDIRECT 137.6 10/19/2009   LDLDIRECT 173.3 07/18/2009     Osteopenia  Last dexa in 09 Vit D level is 49- that is improved  Exercise -- zumba twice a week - really likes it  Uses her daughter's elliptical   thinkiing of retirement is end of the year  Does HR and payroll  Past Medical History  Diagnosis Date  .  Hypothyroid   . Hyperlipidemia   . Lactose intolerance   . Osteopenia     Past Surgical History  Procedure Date  . Back surgery 1988    lumbar disc surgery    History   Social History  . Marital Status: Married    Spouse Name: N/A    Number of Children: N/A  . Years of Education: N/A   Occupational History  . Not on file.   Social History Main Topics  . Smoking status: Never Smoker   . Smokeless tobacco: Not on file  . Alcohol Use: Not on file  . Drug Use: Not on file  . Sexually Active: Not on file   Other Topics Concern  . Not on file   Social History Narrative  . No narrative on file    Family History  Problem Relation Age of Onset  . Heart disease Mother     chf  . Cancer Paternal Uncle   . Diabetes Maternal Grandfather     Allergies  Allergen Reactions  . Propoxyphene N-Acetaminophen     REACTION: Nausea and Vomiting  Review of Systems  Skin:       Stays itchy under breasts all the time  Review of Systems  Constitutional: Negative for fever, appetite change, fatigue and unexpected weight change.  Eyes: Negative for pain and visual disturbance.  Respiratory: Negative for cough and shortness of breath.   Cardiovascular: Negative.for cp or sob or edema    Gastrointestinal: Negative for nausea, diarrhea and constipation.  Genitourinary: Negative for urgency and frequency.  Skin: Negative for pallor.  Neurological: Negative for weakness, light-headedness, numbness and headaches.  Hematological: Negative for adenopathy. Does not bruise/bleed easily.  Psychiatric/Behavioral: Negative for dysphoric mood. The patient is not nervous/anxious.         Objective:   Physical Exam  Constitutional: She appears well-developed and well-nourished. No distress.       overwt and well appearing   HENT:  Head: Normocephalic and atraumatic.  Right Ear: External ear normal.  Left Ear: External ear normal.  Nose: Nose normal.  Mouth/Throat: Oropharynx is  clear and moist.  Eyes: Conjunctivae and EOM are normal. Pupils are equal, round, and reactive to light.  Neck: Normal range of motion. Neck supple. No JVD present. Carotid bruit is not present. No thyromegaly present.  Cardiovascular: Normal rate, regular rhythm and normal heart sounds.   Pulmonary/Chest: Effort normal and breath sounds normal. No respiratory distress. She has no wheezes.  Abdominal: Soft. Bowel sounds are normal. She exhibits no distension, no abdominal bruit and no mass. There is no tenderness.  Genitourinary: No breast swelling, tenderness, discharge or bleeding.  Musculoskeletal: Normal range of motion. She exhibits no edema and no tenderness.  Lymphadenopathy:    She has no cervical adenopathy.  Neurological: She is alert. She has normal reflexes. No cranial nerve deficit. Coordination normal.  Skin: Skin is warm and dry. No rash noted. No erythema. No pallor.       sks in breast area No redness or rash  Psychiatric: She has a normal mood and affect.          Assessment & Plan:

## 2010-08-23 NOTE — Assessment & Plan Note (Signed)
Doing well with theraputic tsh and no clinical changes Rev labs No change in dose- refilled for [facility]

## 2010-08-29 ENCOUNTER — Other Ambulatory Visit: Payer: Medicare Other

## 2010-09-06 ENCOUNTER — Ambulatory Visit
Admission: RE | Admit: 2010-09-06 | Discharge: 2010-09-06 | Disposition: A | Discharge status: Home / Self Care | Payer: Medicare Other | Source: Ambulatory Visit | Attending: Family Medicine | Admitting: Family Medicine

## 2010-09-06 DIAGNOSIS — Z780 Asymptomatic menopausal state: Secondary | ICD-10-CM

## 2010-09-06 LAB — HM DEXA SCAN

## 2011-05-16 ENCOUNTER — Other Ambulatory Visit: Payer: Self-pay | Admitting: Family Medicine

## 2011-05-16 DIAGNOSIS — Z1231 Encounter for screening mammogram for malignant neoplasm of breast: Secondary | ICD-10-CM

## 2011-06-03 ENCOUNTER — Ambulatory Visit
Admission: RE | Admit: 2011-06-03 | Discharge: 2011-06-03 | Disposition: A | Discharge status: Home / Self Care | Payer: Medicare Other | Source: Ambulatory Visit | Attending: Family Medicine | Admitting: Family Medicine

## 2011-06-03 DIAGNOSIS — Z1231 Encounter for screening mammogram for malignant neoplasm of breast: Secondary | ICD-10-CM

## 2011-06-05 ENCOUNTER — Encounter: Payer: Self-pay | Admitting: *Deleted

## 2011-06-05 ENCOUNTER — Encounter: Payer: Self-pay | Admitting: Family Medicine

## 2011-06-06 ENCOUNTER — Ambulatory Visit: Payer: Medicare Other

## 2011-07-14 ENCOUNTER — Other Ambulatory Visit: Payer: Self-pay | Admitting: Dermatology

## 2011-09-18 ENCOUNTER — Other Ambulatory Visit: Payer: Self-pay | Admitting: *Deleted

## 2011-09-18 NOTE — Telephone Encounter (Signed)
Faxed refill request.  Patient has not been seen since May 2012.  Please advise.

## 2011-09-19 MED ORDER — LEVOTHYROXINE SODIUM 75 MCG PO TABS: ORAL_TABLET | ORAL | Status: DC

## 2011-09-19 NOTE — Telephone Encounter (Signed)
Please make f/u appt Will refill electronically

## 2011-09-19 NOTE — Telephone Encounter (Signed)
Patient says she has an appt for Oct 15th and that was the soonest that she could get an appt.  She will need another refill prior to that also.

## 2011-09-20 MED ORDER — LEVOTHYROXINE SODIUM 75 MCG PO TABS: ORAL_TABLET | ORAL | Status: DC

## 2011-09-20 NOTE — Addendum Note (Signed)
Addended by: Roxy Manns A on: 09/20/2011 09:09 AM   Modules accepted: Orders

## 2011-09-20 NOTE — Telephone Encounter (Signed)
Please let her know I did it, thanks

## 2011-11-27 ENCOUNTER — Other Ambulatory Visit: Payer: Self-pay

## 2011-11-27 MED ORDER — LEVOTHYROXINE SODIUM 75 MCG PO TABS: ORAL_TABLET | ORAL | Status: DC

## 2011-11-27 NOTE — Telephone Encounter (Signed)
Pt request refill Levothyroxine to [facility]; pt scheduled cpx 01/20/12. Pt advised refill sent.

## 2012-01-20 ENCOUNTER — Ambulatory Visit (INDEPENDENT_AMBULATORY_CARE_PROVIDER_SITE_OTHER): Payer: Medicare Other | Admitting: Family Medicine

## 2012-01-20 ENCOUNTER — Encounter: Payer: Self-pay | Admitting: Family Medicine

## 2012-01-20 VITALS — BP 124/76 | HR 76 | Temp 98.2°F | Ht 63.5 in | Wt 150.8 lb

## 2012-01-20 DIAGNOSIS — E7800 Pure hypercholesterolemia, unspecified: Secondary | ICD-10-CM

## 2012-01-20 DIAGNOSIS — M949 Disorder of cartilage, unspecified: Secondary | ICD-10-CM

## 2012-01-20 DIAGNOSIS — Z23 Encounter for immunization: Secondary | ICD-10-CM

## 2012-01-20 DIAGNOSIS — E039 Hypothyroidism, unspecified: Secondary | ICD-10-CM

## 2012-01-20 DIAGNOSIS — M899 Disorder of bone, unspecified: Secondary | ICD-10-CM

## 2012-01-20 LAB — TSH: TSH: 1.56 u[IU]/mL (ref 0.35–5.50)

## 2012-01-20 LAB — COMPREHENSIVE METABOLIC PANEL
AST: 21 U/L (ref 0–37)
Albumin: 3.6 g/dL (ref 3.5–5.2)
Alkaline Phosphatase: 66 U/L (ref 39–117)
BUN: 13 mg/dL (ref 6–23)
Calcium: 9 mg/dL (ref 8.4–10.5)
Creatinine, Ser: 0.9 mg/dL (ref 0.4–1.2)
Glucose, Bld: 78 mg/dL (ref 70–99)

## 2012-01-20 LAB — LIPID PANEL
Cholesterol: 203 mg/dL — ABNORMAL HIGH (ref 0–200)
Total CHOL/HDL Ratio: 6
Triglycerides: 165 mg/dL — ABNORMAL HIGH (ref 0.0–149.0)
VLDL: 33 mg/dL (ref 0.0–40.0)

## 2012-01-20 MED ORDER — LEVOTHYROXINE SODIUM 75 MCG PO TABS: ORAL_TABLET | ORAL | Status: DC

## 2012-01-20 NOTE — Patient Instructions (Addendum)
Labs today Flu shot and pneumonia shot today  Don't forget your mammogram will be due in February Keep up healthy diet and exercise

## 2012-01-20 NOTE — Progress Notes (Signed)
Subjective:    Patient ID: Valerie Ho, female    DOB: Feb 13, 1941, 71 y.o.   MRN: [id number]  HPI Here for check up of chronic medical conditions and to review health mt list   Retired at the end of march  Wt is down 2 lb with stable bmi  Is more active and exercising now - doing the [employer] fitness/ silver sneakers program at [employer] of outdoor work also  Is also eating a healthy diet   Zoster status- not interested in a vaccine   ptx -- will get that today too   Flu - wants to get one today  colonosc 10/09- 10 year recall - no polyps , no family hx   Not at any fall risk  No depression- mood is good   2/13 mammo nl - she will make her own appt  Self exam --no lumps   Gyn-- no gyn problems No abn paps  Not sexually active   She sees Dr Terri Piedra for derm regularly Had a basal cell lesion removed from her arm  Needs labs today Old labs:    Chemistry      Component Value Date/Time   NA 140 08/20/2010 0845   K 4.3 08/20/2010 0845   CL 107 08/20/2010 0845   CO2 28 08/20/2010 0845   BUN 16 08/20/2010 0845   CREATININE 0.8 08/20/2010 0845      Component Value Date/Time   CALCIUM 8.8 08/20/2010 0845   ALKPHOS 59 08/20/2010 0845   AST 22 08/20/2010 0845   ALT 18 08/20/2010 0845   BILITOT 0.4 08/20/2010 0845      Lab Results  Component Value Date   TSH 2.05 08/20/2010  no changes at all and no symptoms at all Hair and skin are fine  [facility] mail changed her to DAW    Lab Results  Component Value Date   WBC 6.1 12/09/2007   HGB 14.1 12/09/2007   HCT 41.5 12/09/2007   MCV 88.9 12/09/2007   PLT 233 12/09/2007   vit d level nl  Osteopenia   Lab Results  Component Value Date   CHOL 193 08/20/2010   CHOL 208* 10/19/2009   CHOL 235* 07/18/2009   Lab Results  Component Value Date   HDL 38.20* 08/20/2010   HDL 36.10* 10/19/2009   HDL 46.80 07/18/2009   Lab Results  Component Value Date   LDLCALC 135* 08/20/2010   LDLCALC 145* 03/09/2008   LDLCALC 144* 12/09/2007   Lab  Results  Component Value Date   TRIG 99.0 08/20/2010   TRIG 113.0 10/19/2009   TRIG 106.0 07/18/2009   Lab Results  Component Value Date   CHOLHDL 5 08/20/2010   CHOLHDL 6 10/19/2009   CHOLHDL 5 07/18/2009   Lab Results  Component Value Date   LDLDIRECT 137.6 10/19/2009   LDLDIRECT 173.3 07/18/2009     Patient Active Problem List  Diagnosis  . HYPOTHYROIDISM  . PURE HYPERCHOLESTEROLEMIA  . OSTEOPENIA  . INTESTINAL GAS  . Post-menopausal   Past Medical History  Diagnosis Date  . Hypothyroid   . Hyperlipidemia   . Lactose intolerance   . Osteopenia    Past Surgical History  Procedure Date  . Back surgery 1988    lumbar disc surgery   History  Substance Use Topics  . Smoking status: Never Smoker   . Smokeless tobacco: Not on file  . Alcohol Use: No   Family History  Problem Relation Age of Onset  . Heart  disease Mother     chf  . Cancer Paternal Uncle   . Diabetes Maternal Grandfather    Allergies  Allergen Reactions  . Propoxyphene-Acetaminophen     REACTION: Nausea and Vomiting   Current Outpatient Prescriptions on File Prior to Visit  Medication Sig Dispense Refill  . Calcium Carbonate-Vitamin D (CALCIUM-VITAMIN D) 600-200 MG-UNIT CAPS Take 1 tablet by mouth 2 (two) times daily.        . Cholecalciferol (VITAMIN D) 1000 UNITS capsule Take 1,000 Units by mouth daily.        Marland Kitchen levothyroxine (SYNTHROID, LEVOTHROID) 75 MCG tablet Take one by mouth daily  90 tablet  0  . Omega-3 Fatty Acids (FISH OIL) 1200 MG CAPS Take 2 capsules by mouth daily.        Marland Kitchen GELATIN PO 10 grains taking 2 tablets by mouth twice a day.         Review of Systems Review of Systems  Constitutional: Negative for fever, appetite change, fatigue and unexpected weight change.  Eyes: Negative for pain and visual disturbance.  Respiratory: Negative for cough and shortness of breath.   Cardiovascular: Negative for cp or palpitations    Gastrointestinal: Negative for nausea, diarrhea and  constipation.  Genitourinary: Negative for urgency and frequency.  Skin: Negative for pallor or rash   Neurological: Negative for weakness, light-headedness, numbness and headaches.  Hematological: Negative for adenopathy. Does not bruise/bleed easily.  Psychiatric/Behavioral: Negative for dysphoric mood. The patient is not nervous/anxious.         Objective:   Physical Exam  Constitutional: She appears well-developed and well-nourished.  HENT:  Head: Normocephalic and atraumatic.  Right Ear: External ear normal.  Left Ear: External ear normal.  Nose: Nose normal.  Mouth/Throat: Oropharynx is clear and moist.  Eyes: Conjunctivae normal and EOM are normal. Pupils are equal, round, and reactive to light. No scleral icterus.  Neck: Normal range of motion. Neck supple. No JVD present. Carotid bruit is not present. No thyromegaly present.  Cardiovascular: Normal rate, regular rhythm, normal heart sounds and intact distal pulses.  Exam reveals no gallop.   Pulmonary/Chest: Effort normal and breath sounds normal. No respiratory distress. She has no wheezes.  Abdominal: Soft. Bowel sounds are normal. She exhibits no distension, no abdominal bruit and no mass. There is no tenderness.  Genitourinary: No breast swelling, tenderness, discharge or bleeding.       Breast exam: No mass, nodules, thickening, tenderness, bulging, retraction, inflamation, nipple discharge or skin changes noted.  No axillary or clavicular LA.  Chaperoned exam.    Musculoskeletal: Normal range of motion. She exhibits no edema and no tenderness.  Lymphadenopathy:    She has no cervical adenopathy.  Neurological: She is alert. She has normal reflexes. No cranial nerve deficit. She exhibits normal muscle tone. Coordination normal.  Skin: Skin is warm and dry. No rash noted. No erythema. No pallor.  Psychiatric: She has a normal mood and affect.          Assessment & Plan:

## 2012-01-22 ENCOUNTER — Encounter: Payer: Self-pay | Admitting: *Deleted

## 2012-03-15 ENCOUNTER — Other Ambulatory Visit: Payer: Self-pay

## 2012-03-15 NOTE — Telephone Encounter (Signed)
Pt spoke with [facility] and they did not have levothyroxine refill. i spoke with Bjorn Loser and she saw refill but I could not request refill because I did not have form of payment. Pam will call [facility] to order.

## 2012-04-07 DIAGNOSIS — S32020A Wedge compression fracture of second lumbar vertebra, initial encounter for closed fracture: Secondary | ICD-10-CM

## 2012-04-07 HISTORY — DX: Wedge compression fracture of second lumbar vertebra, initial encounter for closed fracture: S32.020A

## 2012-06-22 ENCOUNTER — Other Ambulatory Visit: Payer: Self-pay

## 2012-06-22 DIAGNOSIS — Z1231 Encounter for screening mammogram for malignant neoplasm of breast: Secondary | ICD-10-CM

## 2012-07-22 ENCOUNTER — Other Ambulatory Visit (INDEPENDENT_AMBULATORY_CARE_PROVIDER_SITE_OTHER): Payer: Medicare Other

## 2012-07-22 ENCOUNTER — Ambulatory Visit
Admission: RE | Admit: 2012-07-22 | Discharge: 2012-07-22 | Disposition: A | Discharge status: Home / Self Care | Payer: Medicare Other | Source: Ambulatory Visit

## 2012-07-22 DIAGNOSIS — E7800 Pure hypercholesterolemia, unspecified: Secondary | ICD-10-CM

## 2012-07-22 DIAGNOSIS — Z1231 Encounter for screening mammogram for malignant neoplasm of breast: Secondary | ICD-10-CM

## 2012-07-22 LAB — AST: AST: 20 U/L (ref 0–37)

## 2012-07-22 LAB — LIPID PANEL
Cholesterol: 217 mg/dL — ABNORMAL HIGH (ref 0–200)
Total CHOL/HDL Ratio: 6
Triglycerides: 134 mg/dL (ref 0.0–149.0)
VLDL: 26.8 mg/dL (ref 0.0–40.0)

## 2012-07-22 LAB — ALT: ALT: 20 U/L (ref 0–35)

## 2012-07-22 LAB — LDL CHOLESTEROL, DIRECT: Direct LDL: 146.5 mg/dL

## 2012-07-22 NOTE — Addendum Note (Signed)
Addended by: Alvina Chou on: 07/22/2012 09:20 AM   Modules accepted: Orders

## 2012-07-23 ENCOUNTER — Encounter: Payer: Self-pay | Admitting: *Deleted

## 2012-07-27 ENCOUNTER — Ambulatory Visit: Payer: Medicare Other | Admitting: Family Medicine

## 2012-07-28 ENCOUNTER — Ambulatory Visit (INDEPENDENT_AMBULATORY_CARE_PROVIDER_SITE_OTHER): Payer: Medicare Other | Admitting: Family Medicine

## 2012-07-28 ENCOUNTER — Encounter: Payer: Self-pay | Admitting: Family Medicine

## 2012-07-28 VITALS — BP 136/82 | HR 82 | Temp 97.8°F | Ht 63.5 in | Wt 153.2 lb

## 2012-07-28 DIAGNOSIS — E7800 Pure hypercholesterolemia, unspecified: Secondary | ICD-10-CM

## 2012-07-28 NOTE — Assessment & Plan Note (Signed)
Disc goals for lipids and reasons to control them Rev labs with pt Rev low sat fat diet in detail Still need to inc HDL and lower LDL-not much change Rev diet- will cut red meat and cheese and biscuits  Re check 3 mo If not imp may need to disc statin

## 2012-07-28 NOTE — Progress Notes (Signed)
Subjective:    Patient ID: Valerie Ho, female    DOB: Sep 23, 1940, 72 y.o.   MRN: [id number]  HPI Here for f/u of hyperlipidemia   Feeling good overall    Wt is up 3 lb with bmi of 26 Just had easter dinner   Hyperlipidemia Diet controlled Lab Results  Component Value Date   CHOL 217* 07/22/2012   CHOL 203* 01/20/2012   CHOL 193 08/20/2010   Lab Results  Component Value Date   HDL 34.80* 07/22/2012   HDL 33.80* 01/20/2012   HDL 38.20* 08/20/2010   Lab Results  Component Value Date   LDLCALC 135* 08/20/2010   LDLCALC 145* 03/09/2008   LDLCALC 144* 12/09/2007   Lab Results  Component Value Date   TRIG 134.0 07/22/2012   TRIG 165.0* 01/20/2012   TRIG 99.0 08/20/2010   Lab Results  Component Value Date   CHOLHDL 6 07/22/2012   CHOLHDL 6 01/20/2012   CHOLHDL 5 08/20/2010   Lab Results  Component Value Date   LDLDIRECT 146.5 07/22/2012   LDLDIRECT 143.4 01/20/2012   LDLDIRECT 137.6 10/19/2009   goes to exercise classes twice per week at the hospital ([employer] program)- walks 2 miles 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 HYPOTHYROIDISM  . PURE HYPERCHOLESTEROLEMIA  . OSTEOPENIA  . INTESTINAL GAS  . Post-menopausal   Past Medical History  Diagnosis Date  . Hypothyroid   . Hyperlipidemia   . Lactose intolerance   . Osteopenia    Past Surgical History  Procedure Laterality Date  . Back surgery  1988    lumbar disc surgery   History  Substance Use Topics  . Smoking status: Never Smoker   . Smokeless tobacco: Not on file  . Alcohol Use: No   Family History  Problem Relation Age of Onset  . Heart disease Mother     chf  . Cancer  Paternal Uncle   . Diabetes Maternal Grandfather    Allergies  Allergen Reactions  . Propoxyphene-Acetaminophen     REACTION: Nausea and Vomiting   Current Outpatient Prescriptions on File Prior to Visit  Medication Sig Dispense Refill  . Calcium Carbonate-Vitamin D (CALCIUM-VITAMIN D) 600-200 MG-UNIT CAPS Take 1 tablet by mouth 2 (two) times daily.        . Cholecalciferol (VITAMIN D) 1000 UNITS capsule Take 1,000 Units by mouth daily.        Marland Kitchen levothyroxine (SYNTHROID, LEVOTHROID) 75 MCG tablet Take one by mouth daily  90 tablet  3  . meloxicam (MOBIC) 15 MG tablet Take 15 mg by mouth as needed.      Marland Kitchen GELATIN PO 10 grains taking 2 tablets by mouth twice a day.        No current facility-administered medications on file prior to visit.    Review of Systems Review of Systems  Constitutional: Negative for fever, appetite change, fatigue and unexpected weight change.  Eyes: Negative for pain and visual disturbance.  Respiratory: Negative for cough and shortness of breath.   Cardiovascular: Negative for cp or palpitations    Gastrointestinal: Negative for nausea, diarrhea and constipation.  Genitourinary: Negative  for urgency and frequency.  Skin: Negative for pallor or rash   Neurological: Negative for weakness, light-headedness, numbness and headaches.  Hematological: Negative for adenopathy. Does not bruise/bleed easily.  Psychiatric/Behavioral: Negative for dysphoric mood. The patient is not nervous/anxious.         Objective:   Physical Exam  Constitutional: She appears well-developed and well-nourished. No distress.  HENT:  Head: Normocephalic and atraumatic.  Mouth/Throat: Oropharynx is clear and moist.  Eyes: Conjunctivae and EOM are normal. Pupils are equal, round, and reactive to light. No scleral icterus.  Neck: Normal range of motion. Neck supple. Carotid bruit is not present.  Cardiovascular: Normal rate, normal heart sounds and intact distal pulses.  Exam reveals  no gallop.   Pulmonary/Chest: Effort normal and breath sounds normal.  Abdominal: She exhibits no abdominal bruit.  Lymphadenopathy:    She has no cervical adenopathy.  Neurological: She is alert.  Skin: Skin is warm and dry. No rash noted. No pallor.  Psychiatric: She has a normal mood and affect.          Assessment & Plan:

## 2012-07-28 NOTE — Patient Instructions (Addendum)
Avoid red meat/ fried foods/ egg yolks/ fatty breakfast meats/ butter, cheese and high fat dairy/ and shellfish   Also increase "extra" exercise to 5 days per week (30 minutes) - work up to that (even if you have an active lifestyle to begin with) If cholesterol does not improve - then this is genetic and we can discuss medication Schedule fasting lab in 3 months for cholesterol

## 2012-10-02 ENCOUNTER — Encounter (HOSPITAL_COMMUNITY): Payer: Self-pay | Admitting: Emergency Medicine

## 2012-10-02 ENCOUNTER — Emergency Department (HOSPITAL_COMMUNITY)
Admission: EM | Admit: 2012-10-02 | Discharge: 2012-10-02 | Disposition: A | Discharge status: Home / Self Care | Payer: Medicare Other | Attending: Emergency Medicine | Admitting: Emergency Medicine

## 2012-10-02 ENCOUNTER — Emergency Department (HOSPITAL_COMMUNITY): Payer: Medicare Other

## 2012-10-02 DIAGNOSIS — Z8739 Personal history of other diseases of the musculoskeletal system and connective tissue: Secondary | ICD-10-CM | POA: Insufficient documentation

## 2012-10-02 DIAGNOSIS — Z79899 Other long term (current) drug therapy: Secondary | ICD-10-CM | POA: Insufficient documentation

## 2012-10-02 DIAGNOSIS — S32000A Wedge compression fracture of unspecified lumbar vertebra, initial encounter for closed fracture: Secondary | ICD-10-CM

## 2012-10-02 DIAGNOSIS — Y9289 Other specified places as the place of occurrence of the external cause: Secondary | ICD-10-CM | POA: Insufficient documentation

## 2012-10-02 DIAGNOSIS — Y939 Activity, unspecified: Secondary | ICD-10-CM | POA: Insufficient documentation

## 2012-10-02 DIAGNOSIS — E039 Hypothyroidism, unspecified: Secondary | ICD-10-CM | POA: Insufficient documentation

## 2012-10-02 DIAGNOSIS — S32009A Unspecified fracture of unspecified lumbar vertebra, initial encounter for closed fracture: Secondary | ICD-10-CM | POA: Insufficient documentation

## 2012-10-02 DIAGNOSIS — E785 Hyperlipidemia, unspecified: Secondary | ICD-10-CM | POA: Insufficient documentation

## 2012-10-02 DIAGNOSIS — W010XXA Fall on same level from slipping, tripping and stumbling without subsequent striking against object, initial encounter: Secondary | ICD-10-CM | POA: Insufficient documentation

## 2012-10-02 DIAGNOSIS — Z7982 Long term (current) use of aspirin: Secondary | ICD-10-CM | POA: Insufficient documentation

## 2012-10-02 MED ORDER — OXYCODONE-ACETAMINOPHEN 5-325 MG PO TABS
1.0000 | ORAL_TABLET | Freq: Once | ORAL | Status: AC
  Administered 2012-10-02: 1 via ORAL
  Filled 2012-10-02: qty 1

## 2012-10-02 MED ORDER — OXYCODONE-ACETAMINOPHEN 5-325 MG PO TABS: 2.0000 | ORAL_TABLET | ORAL | Status: DC | PRN

## 2012-10-02 MED ORDER — ONDANSETRON HCL 4 MG PO TABS: 4.0000 mg | ORAL_TABLET | Freq: Four times a day (QID) | ORAL | Status: DC

## 2012-10-02 NOTE — ED Provider Notes (Signed)
Medical screening examination/treatment/procedure(s) were performed by non-physician practitioner and as supervising physician I was immediately available for consultation/collaboration.   [name]. Urie Loughner, MD 10/02/12 1956

## 2012-10-02 NOTE — ED Notes (Addendum)
Pt states she was outside working in her flower bed and was helping the plummer pull a hose out of the well when she tripped over a root and fell. Pt c/o lower back pain "right below my waist band".  Pt is wearing a back brace from home.

## 2012-10-02 NOTE — ED Provider Notes (Signed)
History  This chart was scribed for non-physician practitioner working with [name]. [name], MD by Greggory Stallion, ED scribe. This patient was seen in room WTR6/WTR6 and the patient's care was started at 5:30 PM.  CSN: [id number] Arrival date & time 10/02/12  1708   Chief Complaint  Patient presents with  . Fall  . Back Pain    Patient is a 72 y.o. female presenting with fall and back pain. The history is provided by the patient. No language interpreter was used.  Fall This is a new problem. The current episode started 1 to 2 hours ago. The problem occurs constantly. Pertinent negatives include no chest pain, no abdominal pain, no headaches and no shortness of breath. Nothing aggravates the symptoms. Nothing relieves the symptoms.  Back Pain Location:  Lumbar spine Radiates to:  Does not radiate Pain severity:  Mild Onset quality:  Sudden Duration:  1 hour Timing:  Constant Chronicity:  New Context: falling   Relieved by:  Nothing (sitting up) Worsened by:  Nothing tried Ineffective treatments: ice, advil, muscle relaxer. Associated symptoms: no abdominal pain, no chest pain and no headaches     HPI Comments: Valerie Ho is a 72 y.o. 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 She rates her pain at 4/10. Pt states she tripped over a root, landed in dirt on her right back side. Pt denies hitting her head or LOC. Pt states the pain is not has bad if she sits up. Pain is currently 4/10.  Her friend gave her a back brace which she currently wearing and it has helped quite a bit.  Pt denies CP and abdominal pain as associated symptoms. Denies any numbness or weakness.  Denies any radicular pain.  She states she has taken a muscle relaxer, Advil, and used ice with moderate relief.   Past Medical History  Diagnosis Date  . Hypothyroid   . Hyperlipidemia   . Lactose  intolerance   . Osteopenia    Past Surgical History  Procedure Laterality Date  . Back surgery  1988    lumbar disc surgery   Family History  Problem Relation Age of Onset  . Heart disease Mother     chf  . Cancer Paternal Uncle   . Diabetes Maternal Grandfather    History  Substance Use Topics  . Smoking status: Never Smoker   . Smokeless tobacco: Not on file  . Alcohol Use: No   OB History   Grav Para Term Preterm Abortions TAB SAB Ect Mult Living                 Review of Systems  Respiratory: Negative for shortness of breath.   Cardiovascular: Negative for chest pain.  Gastrointestinal: Negative for abdominal pain.  Musculoskeletal: Positive for back pain.  Neurological: Negative for headaches.  All other systems reviewed and are negative.    Allergies  Propoxyphene-acetaminophen  Home Medications   Current Outpatient Rx  Name  Route  Sig  Dispense  Refill  . Calcium Carbonate-Vitamin D (CALCIUM-VITAMIN D) 600-200 MG-UNIT CAPS   Oral   Take 1 tablet by mouth 2 (two) times daily.           . Cholecalciferol (VITAMIN D) 1000 UNITS capsule   Oral   Take 1,000 Units by mouth daily.           . Flaxseed, Linseed, (FLAXSEED  OIL PO)   Oral   Take 1 tablet by mouth daily.         Marland Kitchen GELATIN PO      10 grains taking 2 tablets by mouth twice a day.          . levothyroxine (SYNTHROID, LEVOTHROID) 75 MCG tablet      Take one by mouth daily   90 tablet   3     Generic please!   . meloxicam (MOBIC) 15 MG tablet   Oral   Take 15 mg by mouth as needed.          BP 131/64  Pulse 78  Temp(Src) 97.6 F (36.4 C) (Oral)  Resp 18  SpO2 95%  Physical Exam  Nursing note and vitals reviewed. Constitutional: She is oriented to person, place, and time. She appears well-developed and well-nourished. No distress.  HENT:  Head: Normocephalic and atraumatic.  Eyes: EOM are normal.  Neck: Neck supple. No tracheal deviation present.  Cardiovascular:  Normal rate and intact distal pulses.   Pulmonary/Chest: Effort normal. No respiratory distress.  Abdominal: Soft.  Musculoskeletal: Normal range of motion. She exhibits no edema and no tenderness.   tenderness to midline lumbar region. No crepitance or step off.  5/5 strength to all 4 extremities.     Neurological: She is alert and oriented to person, place, and time.  Normal gait, no foot drop, patella DTR 2+  Skin: Skin is warm and dry. No rash noted. No erythema.  Well healing midline surgical scar. No bruising. No laceration noted.   Psychiatric: She has a normal mood and affect. Her behavior is normal.    ED Course  Procedures (including critical care time)  DIAGNOSTIC STUDIES: Oxygen Saturation is 95% on RA, adequate by my interpretation.    COORDINATION OF CARE: 5:35 PM-Discussed treatment plan which includes xray with pt at bedside and pt agreed to plan.   6:07 PM Xray shows an acute compression fx involving L3.  Radiologist recommend CT to r/o burst fx.  CT ordered.  Pt is currently comfortable, pain medication offered, pt decline.  No neurovascular compromise.  Care discussed with attending.    7:50 PM CT confirms acute fx of L3 without other acute abnormalities.  Pt's orthopedist is Dr. Luiz Blare, which i will refer to.  Pt currently comfortable, able to ambulate.  Will dc with care instruction, pain medication and return precaution.    Labs Reviewed - No data to display Dg Lumbar Spine Complete  10/02/2012   *RADIOLOGY REPORT*  Clinical Data: Low back pain status post fall today.  LUMBAR SPINE - COMPLETE 4+ VIEW  Comparison: None.  Findings: There are five lumbar type vertebral bodies.  The bones appear diffusely demineralized.  There is an acute-appearing superior endplate compression fracture at L3 with approximately 40% loss of vertebral body height and displacement of the anterior cortex.  No osseous retropulsion is identified.  The posterior elements appear intact.  The  alignment is normal.  No other fractures are demonstrated.  There is disc space loss at L4-L5 and L5-S1.  Facet degenerative changes are present inferiorly.  IMPRESSION: Acute appearing superior endplate compression fracture at L3.  No demonstrated osseous retropulsion; CT should be considered to exclude a burst component.  Mild lower lumbar spondylosis.   Original Report Authenticated By: Carey Bullocks, M.D.   Ct Lumbar Spine Wo Contrast  10/02/2012   *RADIOLOGY REPORT*  Clinical Data: Fall with L3 fracture.  The  CT LUMBAR SPINE  WITHOUT CONTRAST  Technique:  Multidetector CT imaging of the lumbar spine was performed without intravenous contrast administration. Multiplanar CT image reconstructions were also generated.  Comparison: Radiography same day  Findings: There is no significant finding at L2 or above.  There is a compression fracture of the L3 vertebral body affecting the superior portion.  There is loss of height of 40%.  There is bowing of the posterior vertebral body by 4 mm, encroaching upon the spinal canal.  There is mild stenosis of the lateral recesses at this level.  There is some facet degeneration at the L2-3 level.  L3-4:  Bilateral facet arthropathy with 2 mm of anterolisthesis. Circumferential bulging of the disc.  Moderate multifactorial stenosis.  L4-5:  Chronic disc degeneration with loss of height.  Previous decompressive surgery on the right.  Sufficient patency of the canal and foramina.  L5-S1:  Bulging of the disc more towards the right.  Mild facet degeneration.  No significant stenosis.  IMPRESSION: Acute fracture at L3 affecting the superior portion of the vertebral body.  Loss of height of 40%.  Posterior bowing of the posterior-superior margin of the vertebral body by 4 mm.  Moderate stenosis of the canal and lateral recesses at this level, also contributed to by facet arthropathy.  Moderate multifactorial stenosis at L3-4 because of facet arthropathy with 2 mm of  anterolisthesis and bulging of the disc.  Previous decompression and discectomy at L4-5 has a good appearance.   Original Report Authenticated By: Paulina Fusi, M.D.   1. Lumbar compression fracture, closed, initial encounter     MDM  BP 131/64  Pulse 78  Temp(Src) 97.6 F (36.4 C) (Oral)  Resp 18  SpO2 95%  I have reviewed nursing notes and vital signs. I personally reviewed the imaging tests through PACS system  I reviewed available ER/hospitalization records thought the EMR   I personally performed the services described in this documentation, which was scribed in my presence. The recorded information has been reviewed and is accurate.    Fayrene Helper, PA-C 10/02/12 1952

## 2012-10-26 ENCOUNTER — Ambulatory Visit (INDEPENDENT_AMBULATORY_CARE_PROVIDER_SITE_OTHER): Payer: Medicare Other | Admitting: Family Medicine

## 2012-10-26 ENCOUNTER — Telehealth: Payer: Self-pay

## 2012-10-26 ENCOUNTER — Encounter: Payer: Self-pay | Admitting: Family Medicine

## 2012-10-26 VITALS — BP 108/68 | HR 87 | Temp 98.4°F | Ht 63.5 in | Wt 145.5 lb

## 2012-10-26 DIAGNOSIS — Z8781 Personal history of (healed) traumatic fracture: Secondary | ICD-10-CM

## 2012-10-26 DIAGNOSIS — E7800 Pure hypercholesterolemia, unspecified: Secondary | ICD-10-CM

## 2012-10-26 DIAGNOSIS — M899 Disorder of bone, unspecified: Secondary | ICD-10-CM

## 2012-10-26 DIAGNOSIS — J209 Acute bronchitis, unspecified: Secondary | ICD-10-CM | POA: Insufficient documentation

## 2012-10-26 LAB — LIPID PANEL
HDL: 33.3 mg/dL — ABNORMAL LOW (ref >39.00)
Total CHOL/HDL Ratio: 6
VLDL: 26.6 mg/dL (ref 0.0–40.0)

## 2012-10-26 MED ORDER — AZITHROMYCIN 250 MG PO TABS: ORAL_TABLET | ORAL | Status: DC

## 2012-10-26 MED ORDER — ALBUTEROL SULFATE HFA 108 (90 BASE) MCG/ACT IN AERS: 2.0000 | INHALATION_SPRAY | RESPIRATORY_TRACT | Status: DC | PRN

## 2012-10-26 NOTE — Assessment & Plan Note (Signed)
This is re check after diet change If no significant imp will disc statin  Rev low sat fat diet and goals for chol

## 2012-10-26 NOTE — Assessment & Plan Note (Signed)
Recent L2 comp fx Due for 2y dexa On ca and D-disc this and safety concerns Will get dexa when cleared by ortho  May need to disc medication for OP

## 2012-10-26 NOTE — Patient Instructions (Addendum)
Take the zithromax for bronchitis - continue the mucinex and drink lots of fluids  Use the inhaler as needed if wheezing  Update if not starting to improve in a week or if worsening   Labs today  You need a bone density test once your fracture heals (you are due for 2 year check)- let me know when your orthopedic doctor says it is ok to order that

## 2012-10-26 NOTE — Assessment & Plan Note (Signed)
Recent L2 fx from slip and fall  Has a brace and pain control - no procedure (pt states symptoms are mild) Sees ortho for f/u soon- will disc app timing for dexa (is due for 2y)

## 2012-10-26 NOTE — Assessment & Plan Note (Signed)
With green phlegm/ scant wheeze and rhonchi-no other symptoms  Cover with zithromax Albuterol mdi for wheeze prn-inst in use  Mucinex/ expectorant prn  Disc symptomatic care - see instructions on AVS  Update if not starting to improve in a week or if worsening

## 2012-10-26 NOTE — Telephone Encounter (Signed)
Pt has productive cough with green phlegm; pt taking mucinex and robitussin. No fever. Pt request appt. Pt scheduled to see Dr Milinda Antis today at 9:15 am. Pt is scheduled for fasting labs 10/27/12 and pt is fasting today and would like labs drawn today.

## 2012-10-26 NOTE — Telephone Encounter (Signed)
Will see her then 

## 2012-10-26 NOTE — Progress Notes (Signed)
Subjective:    Patient ID: Valerie Ho, female    DOB: [birth year], 72 y.o.   MRN: [id number]  HPI Here for a bad cough-that started last Thursday  Took mucinex  Productive cough  Sounds like a "bull moose"- loud and barky  Not too hoarse  No other symptoms at all -no uri No fever at all   No chemical exposure or smoke exp  Thought dry air made it worse   Had a fall recently = with a comp fx - not severe pain so no procedure-wearing a brace    Patient Active Problem List   Diagnosis Date Noted  . Post-menopausal 08/23/2010  . PURE HYPERCHOLESTEROLEMIA 03/09/2008  . HYPOTHYROIDISM 12/09/2007  . OSTEOPENIA 12/09/2007  . INTESTINAL GAS 12/09/2007   Past Medical History  Diagnosis Date  . Hypothyroid   . Hyperlipidemia   . Lactose intolerance   . Osteopenia    Past Surgical History  Procedure Laterality Date  . Back surgery  1988    lumbar disc surgery   History  Substance Use Topics  . Smoking status: Never Smoker   . Smokeless tobacco: Not on file  . Alcohol Use: No   Family History  Problem Relation Age of Onset  . Heart disease Mother     chf  . Cancer Paternal Uncle   . Diabetes Maternal Grandfather    Allergies  Allergen Reactions  . Propoxyphene-Acetaminophen     REACTION: Nausea and Vomiting   Current Outpatient Prescriptions on File Prior to Visit  Medication Sig Dispense Refill  . aspirin 81 MG tablet Take 81 mg by mouth daily.      . Calcium Carbonate-Vitamin D (CALCIUM-VITAMIN D) 600-200 MG-UNIT CAPS Take 1 tablet by mouth 2 (two) times daily.        . Cholecalciferol (VITAMIN D) 1000 UNITS capsule Take 1,000 Units by mouth daily.        Marland Kitchen levothyroxine (SYNTHROID, LEVOTHROID) 75 MCG tablet Take one by mouth daily  90 tablet  3  . meloxicam (MOBIC) 15 MG tablet Take 15 mg by mouth as needed.       No current facility-administered medications on file prior to visit.    Review of Systems Review of Systems  Constitutional: Negative for fever,  appetite change,  and unexpected weight change.  ENT neg for cong/ facial pain/ rhinorrhea/ st or hoarseness Eyes: Negative for pain and visual disturbance.  Respiratory: Negative for  shortness of breath.  pos for cough  Cardiovascular: Negative for cp or palpitations    Gastrointestinal: Negative for nausea, diarrhea and constipation.  Genitourinary: Negative for urgency and frequency.  Skin: Negative for pallor or rash   MSK pos for mild LS back pain from comp fx -not severe  Neurological: Negative for weakness, light-headedness, numbness and headaches.  Hematological: Negative for adenopathy. Does not bruise/bleed easily.  Psychiatric/Behavioral: Negative for dysphoric mood. The patient is not nervous/anxious.         Objective:   Physical Exam  Constitutional: She appears well-developed and well-nourished. No distress.  HENT:  Head: Normocephalic and atraumatic.  Right Ear: External ear normal.  Left Ear: External ear normal.  Nose: Nose normal.  Mouth/Throat: Oropharynx is clear and moist. No oropharyngeal exudate.  Eyes: Conjunctivae and EOM are normal. Pupils are equal, round, and reactive to light. Right eye exhibits no discharge. Left eye exhibits no discharge.  Neck: Normal range of motion. Neck supple. No JVD present. Carotid bruit is not present. No thyromegaly present.  Cardiovascular: Normal rate, regular rhythm and intact distal pulses.   Pulmonary/Chest: Effort normal. No respiratory distress. She has wheezes. She has no rales. She exhibits no tenderness.  Diffuse rhonchi throughout-worse at bases Junky sounding cough occ end exp wheeze   Abdominal: Soft. Bowel sounds are normal.  Musculoskeletal: Normal range of motion. She exhibits no edema.  Pt is wearing a lumbar support brace   No kyphosis   Lymphadenopathy:    She has no cervical adenopathy.  Neurological: She is alert. She has normal reflexes. No cranial nerve deficit. She exhibits normal muscle tone.  Coordination normal.  Skin: Skin is warm and dry. No rash noted. No erythema. No pallor.  Psychiatric: She has a normal mood and affect.          Assessment & Plan:

## 2012-10-27 ENCOUNTER — Other Ambulatory Visit: Payer: Medicare Other

## 2013-02-10 ENCOUNTER — Other Ambulatory Visit: Payer: Self-pay

## 2013-03-08 ENCOUNTER — Ambulatory Visit (INDEPENDENT_AMBULATORY_CARE_PROVIDER_SITE_OTHER): Payer: Medicare Other

## 2013-03-08 DIAGNOSIS — Z23 Encounter for immunization: Secondary | ICD-10-CM

## 2013-04-08 ENCOUNTER — Other Ambulatory Visit: Payer: Self-pay

## 2013-04-08 MED ORDER — LEVOTHYROXINE SODIUM 75 MCG PO TABS: ORAL_TABLET | ORAL | Status: DC

## 2013-04-08 NOTE — Telephone Encounter (Signed)
Please schedule f/u winter or spring and refill until then

## 2013-04-08 NOTE — Telephone Encounter (Signed)
appt scheduled and medication refilled

## 2013-04-08 NOTE — Telephone Encounter (Signed)
Pt left v/m requesting 30 day refill levothyroxine to [facility]; last TSH done 01/20/2012.Please advise.

## 2013-06-14 ENCOUNTER — Other Ambulatory Visit: Payer: Self-pay

## 2013-06-14 DIAGNOSIS — Z1231 Encounter for screening mammogram for malignant neoplasm of breast: Secondary | ICD-10-CM

## 2013-07-19 ENCOUNTER — Ambulatory Visit (INDEPENDENT_AMBULATORY_CARE_PROVIDER_SITE_OTHER): Payer: Medicare HMO | Admitting: Family Medicine

## 2013-07-19 ENCOUNTER — Encounter: Payer: Self-pay | Admitting: Family Medicine

## 2013-07-19 VITALS — BP 130/76 | HR 76 | Temp 97.9°F | Ht 63.0 in | Wt 148.5 lb

## 2013-07-19 DIAGNOSIS — M899 Disorder of bone, unspecified: Secondary | ICD-10-CM

## 2013-07-19 DIAGNOSIS — E7800 Pure hypercholesterolemia, unspecified: Secondary | ICD-10-CM

## 2013-07-19 DIAGNOSIS — E039 Hypothyroidism, unspecified: Secondary | ICD-10-CM

## 2013-07-19 DIAGNOSIS — M949 Disorder of cartilage, unspecified: Secondary | ICD-10-CM

## 2013-07-19 DIAGNOSIS — Z0000 Encounter for general adult medical examination without abnormal findings: Secondary | ICD-10-CM

## 2013-07-19 LAB — COMPREHENSIVE METABOLIC PANEL
ALT: 21 U/L (ref 0–35)
AST: 19 U/L (ref 0–37)
Albumin: 3.7 g/dL (ref 3.5–5.2)
Alkaline Phosphatase: 64 U/L (ref 39–117)
BILIRUBIN TOTAL: 0.6 mg/dL (ref 0.3–1.2)
BUN: 16 mg/dL (ref 6–23)
CO2: 28 mEq/L (ref 19–32)
CREATININE: 0.8 mg/dL (ref 0.4–1.2)
Calcium: 9.5 mg/dL (ref 8.4–10.5)
Chloride: 103 mEq/L (ref 96–112)
GFR: 71.65 mL/min (ref >60.00)
Glucose, Bld: 88 mg/dL (ref 70–99)
Potassium: 4 mEq/L (ref 3.5–5.1)
Sodium: 138 mEq/L (ref 135–145)
Total Protein: 7.4 g/dL (ref 6.0–8.3)

## 2013-07-19 LAB — CBC WITH DIFFERENTIAL/PLATELET
BASOS ABS: 0.1 10*3/uL (ref 0.0–0.1)
Basophils Relative: 1.1 % (ref 0.0–3.0)
EOS ABS: 0.2 10*3/uL (ref 0.0–0.7)
Eosinophils Relative: 3.1 % (ref 0.0–5.0)
HEMATOCRIT: 40.4 % (ref 36.0–46.0)
HEMOGLOBIN: 13.6 g/dL (ref 12.0–15.0)
LYMPHS ABS: 1.5 10*3/uL (ref 0.7–4.0)
Lymphocytes Relative: 25 % (ref 12.0–46.0)
MCHC: 33.7 g/dL (ref 30.0–36.0)
MCV: 88.3 fl (ref 78.0–100.0)
MONO ABS: 0.5 10*3/uL (ref 0.1–1.0)
Monocytes Relative: 8.3 % (ref 3.0–12.0)
NEUTROS ABS: 3.8 10*3/uL (ref 1.4–7.7)
Neutrophils Relative %: 62.5 % (ref 43.0–77.0)
Platelets: 230 10*3/uL (ref 150.0–400.0)
RBC: 4.58 Mil/uL (ref 3.87–5.11)
RDW: 14.1 % (ref 11.5–14.6)
WBC: 6 10*3/uL (ref 4.5–10.5)

## 2013-07-19 LAB — LIPID PANEL
CHOL/HDL RATIO: 6
CHOLESTEROL: 227 mg/dL — AB (ref 0–200)
HDL: 36.7 mg/dL — AB (ref >39.00)
LDL CALC: 166 mg/dL — AB (ref 0–99)
TRIGLYCERIDES: 120 mg/dL (ref 0.0–149.0)
VLDL: 24 mg/dL (ref 0.0–40.0)

## 2013-07-19 LAB — TSH: TSH: 0.67 u[IU]/mL (ref 0.35–5.50)

## 2013-07-19 NOTE — Progress Notes (Signed)
Pre visit review using our clinic review tool, if applicable. No additional management support is needed unless otherwise documented below in the visit note. 

## 2013-07-19 NOTE — Patient Instructions (Signed)
Keep up the healthy diet and exercise  Stop up front for bone density test referral  Don't forget to get your mammogram  Labs today Also -work on a living will

## 2013-07-19 NOTE — Progress Notes (Signed)
Subjective:    Patient ID: Valerie Ho, female    DOB: [birth year], 73 y.o.   MRN: [id number]  HPI I have personally reviewed the Medicare Annual Wellness questionnaire and have noted 1. The patient's medical and social history 2. Their use of alcohol, tobacco or illicit drugs 3. Their current medications and supplements 4. The patient's functional ability including ADL's, fall risks, home safety risks and hearing or visual             impairment. 5. Diet and physical activities 6. Evidence for depression or mood disorders  The patients weight, height, BMI have been recorded in the chart and visual acuity is per eye clinic.  I have made referrals, counseling and provided education to the patient based review of the above and I have provided the pt with a written personalized care plan for preventive services.  Is feeling very well   See scanned forms.  Routine anticipatory guidance given to patient.  See health maintenance. Colon cancer screening 10/09 - had a 10 year recall  Breast cancer screening - due for mammogram next week and has it scheduled  Self breast exam-no lumps or changes  Flu vaccine 12/14  Tetanus vaccine 9/09  Pneumovax 10/13  Zoster vaccine - she does not want at this time -will let us know if she changes her mind  Advance directive- does not have a living will yet - given a packet on that  Cognitive function addressed- see scanned forms- and if abnormal then additional documentation follows. -no problems at all   Thinks she could eat a little better  Exercise - [facility] to the [facility] program at [facility]  Osteopenia  6/12 dexa was stable  Has had a comp fx Wants to go ahead and schedule that  She is afraid to go on bisphosphenate   Hypothyroid- due for labs    Hyperlipidemia - due for labs    PMH and SH reviewed  Meds, vitals, and allergies reviewed.   ROS: See HPI.  Otherwise negative.    Patient Active Problem List   Diagnosis Date Noted  .  Encounter for Medicare annual wellness exam 07/19/2013  . Acute bronchitis 10/26/2012  . History of compression fracture of spine 10/26/2012  . Post-menopausal 08/23/2010  . PURE HYPERCHOLESTEROLEMIA 03/09/2008  . HYPOTHYROIDISM 12/09/2007  . OSTEOPENIA 12/09/2007  . INTESTINAL GAS 12/09/2007   Past Medical History  Diagnosis Date  . Hypothyroid   . Hyperlipidemia   . Lactose intolerance   . Osteopenia   . Compression fracture of L2 2014    after a fall   . Hyperlipidemia    Past Surgical History  Procedure Laterality Date  . Back surgery  1988    lumbar disc surgery   History  Substance Use Topics  . Smoking status: Never Smoker   . Smokeless tobacco: Not on file  . Alcohol Use: No   Family History  Problem Relation Age of Onset  . Heart disease Mother     chf  . Cancer Paternal Uncle   . Diabetes Maternal Grandfather    Allergies  Allergen Reactions  . Propoxyphene N-Acetaminophen     REACTION: Nausea and Vomiting   Current Outpatient Prescriptions on File Prior to Visit  Medication Sig Dispense Refill  . aspirin 81 MG tablet Take 81 mg by mouth daily.      . Calcium Carbonate-Vitamin D (CALCIUM-VITAMIN D) 600-200 MG-UNIT CAPS Take 1 tablet by mouth 2 (two) times daily.        Marland Kitchen  Ibuprofen (ADVIL) 200 MG CAPS Take 1-2 capsules by mouth at bedtime as needed.      Marland Kitchen levothyroxine (SYNTHROID, LEVOTHROID) 75 MCG tablet Take one by mouth daily  30 tablet  3  . albuterol (PROVENTIL HFA;VENTOLIN HFA) 108 (90 BASE) MCG/ACT inhaler Inhale 2 puffs into the lungs every 4 (four) hours as needed for wheezing.  1 Inhaler  0  . Cholecalciferol (VITAMIN D) 1000 UNITS capsule Take 1,000 Units by mouth daily.         No current facility-administered medications on file prior to visit.    Review of Systems    Review of Systems  Constitutional: Negative for fever, appetite change, fatigue and unexpected weight change.  Eyes: Negative for pain and visual disturbance.    Respiratory: Negative for cough and shortness of breath.   Cardiovascular: Negative for cp or palpitations    Gastrointestinal: Negative for nausea, diarrhea and constipation.  Genitourinary: Negative for urgency and frequency.  Skin: Negative for pallor or rash   Neurological: Negative for weakness, light-headedness, numbness and headaches.  Hematological: Negative for adenopathy. Does not bruise/bleed easily.  Psychiatric/Behavioral: Negative for dysphoric mood. The patient is not nervous/anxious.      Objective:   Physical Exam  Constitutional: She appears well-developed and well-nourished. No distress.  HENT:  Head: Normocephalic and atraumatic.  Right Ear: External ear normal.  Left Ear: External ear normal.  Nose: Nose normal.  Mouth/Throat: Oropharynx is clear and moist.  Eyes: Conjunctivae and EOM are normal. Pupils are equal, round, and reactive to light. Right eye exhibits no discharge. Left eye exhibits no discharge. No scleral icterus.  Neck: Normal range of motion. Neck supple. No JVD present. No thyromegaly present.  Cardiovascular: Normal rate, regular rhythm, normal heart sounds and intact distal pulses.  Exam reveals no gallop.   Pulmonary/Chest: Effort normal and breath sounds normal. No respiratory distress. She has no wheezes. She has no rales.  Abdominal: Soft. Bowel sounds are normal. She exhibits no distension and no mass. There is no tenderness.  Genitourinary: No breast swelling, tenderness, discharge or bleeding.  Breast exam: No mass, nodules, thickening, tenderness, bulging, retraction, inflamation, nipple discharge or skin changes noted.  No axillary or clavicular LA.      Musculoskeletal: She exhibits no edema and no tenderness.  No kyphosis   Lymphadenopathy:    She has no cervical adenopathy.  Neurological: She is alert. She has normal reflexes. No cranial nerve deficit. She exhibits normal muscle tone. Coordination normal.  Skin: Skin is warm and dry.  No rash noted. No erythema. No pallor.  Psychiatric: She has a normal mood and affect.          Assessment & Plan:

## 2013-07-20 LAB — VITAMIN D 25 HYDROXY (VIT D DEFICIENCY, FRACTURES): Vit D, 25-Hydroxy: 53 ng/mL (ref 30–89)

## 2013-07-21 ENCOUNTER — Encounter: Payer: Self-pay | Admitting: *Deleted

## 2013-07-21 NOTE — Assessment & Plan Note (Signed)
Reviewed health habits including diet and exercise and skin cancer prevention Reviewed appropriate screening tests for age  Also reviewed health mt list, fam hx and immunization status , as well as social and family history   See HPI Will work on living will and get her mammogram  Lab today

## 2013-07-21 NOTE — Assessment & Plan Note (Signed)
Lipid panel today Disc low sat fat diet  Also imp of exercise

## 2013-07-21 NOTE — Assessment & Plan Note (Signed)
Schedule dexa D level today Disc need for calcium/ vitamin D/ wt bearing exercise and bone density test every 2 y to monitor Disc safety/ fracture risk in detail

## 2013-07-21 NOTE — Assessment & Plan Note (Signed)
Hypothyroidism  Pt has no clinical changes No change in energy level/ hair or skin/ edema and no tremor Lab Results  Component Value Date   TSH 0.67 07/19/2013

## 2013-07-27 ENCOUNTER — Ambulatory Visit
Admission: RE | Admit: 2013-07-27 | Discharge: 2013-07-27 | Disposition: A | Discharge status: Home / Self Care | Payer: Medicare HMO | Source: Ambulatory Visit

## 2013-07-27 DIAGNOSIS — Z1231 Encounter for screening mammogram for malignant neoplasm of breast: Secondary | ICD-10-CM

## 2013-07-29 ENCOUNTER — Encounter: Payer: Self-pay | Admitting: *Deleted

## 2013-08-01 ENCOUNTER — Ambulatory Visit
Admission: RE | Admit: 2013-08-01 | Discharge: 2013-08-01 | Disposition: A | Discharge status: Home / Self Care | Payer: Self-pay | Source: Ambulatory Visit | Attending: Family Medicine | Admitting: Family Medicine

## 2013-08-01 DIAGNOSIS — M949 Disorder of cartilage, unspecified: Principal | ICD-10-CM

## 2013-08-01 DIAGNOSIS — M899 Disorder of bone, unspecified: Secondary | ICD-10-CM

## 2013-08-01 LAB — HM DEXA SCAN

## 2013-08-09 ENCOUNTER — Encounter: Payer: Self-pay | Admitting: Family Medicine

## 2013-08-09 ENCOUNTER — Encounter: Payer: Self-pay | Admitting: *Deleted

## 2013-08-22 ENCOUNTER — Telehealth: Payer: Self-pay

## 2013-08-22 MED ORDER — LEVOTHYROXINE SODIUM 75 MCG PO TABS: ORAL_TABLET | ORAL | Status: DC

## 2013-08-22 NOTE — Telephone Encounter (Signed)
Looks like the refill was done  [facility] with me to switch if ok with Dr Damita Dunnings

## 2013-08-22 NOTE — Telephone Encounter (Signed)
Pt request refill levothyroxine to [facility]; pt is setting up acct today with [employer];pt advised refill done as requested. pt also wants to change PCP from Dr Glori Bickers to Dr Damita Dunnings. Pt said most of her family goes to Dr Damita Dunnings and that is why she wants to change. Pt request cb when PCP changed.

## 2013-08-23 NOTE — Telephone Encounter (Signed)
Patient notified she can switch to [facility].

## 2013-08-23 NOTE — Telephone Encounter (Signed)
Okay with me. Thanks 

## 2014-03-06 ENCOUNTER — Other Ambulatory Visit: Payer: Self-pay | Admitting: Family Medicine

## 2014-04-06 ENCOUNTER — Other Ambulatory Visit: Payer: Self-pay

## 2014-04-06 MED ORDER — LEVOTHYROXINE SODIUM 75 MCG PO TABS: 75.0000 ug | ORAL_TABLET | Freq: Every day | ORAL | Status: DC

## 2014-04-06 NOTE — Telephone Encounter (Signed)
Pt request refill levothyroxine to [facility].pt has appt on 05/01/14. Advised pt done.

## 2014-04-23 ENCOUNTER — Telehealth: Payer: Self-pay | Admitting: Family Medicine

## 2014-04-23 DIAGNOSIS — E039 Hypothyroidism, unspecified: Secondary | ICD-10-CM

## 2014-04-23 DIAGNOSIS — E7800 Pure hypercholesterolemia, unspecified: Secondary | ICD-10-CM

## 2014-04-23 DIAGNOSIS — M8580 Other specified disorders of bone density and structure, unspecified site: Secondary | ICD-10-CM

## 2014-04-23 NOTE — Telephone Encounter (Signed)
-----   Message from Ellamae Sia sent at 04/19/2014  6:39 PM EST ----- Regarding: Lab orders for Monday, 1.18.16 Patient is scheduled for CPX labs, please order future labs, Thanks , Karna Christmas

## 2014-04-24 ENCOUNTER — Other Ambulatory Visit (INDEPENDENT_AMBULATORY_CARE_PROVIDER_SITE_OTHER): Payer: Medicare HMO

## 2014-04-24 DIAGNOSIS — E7800 Pure hypercholesterolemia, unspecified: Secondary | ICD-10-CM

## 2014-04-24 DIAGNOSIS — M8580 Other specified disorders of bone density and structure, unspecified site: Secondary | ICD-10-CM

## 2014-04-24 DIAGNOSIS — E039 Hypothyroidism, unspecified: Secondary | ICD-10-CM

## 2014-04-24 LAB — COMPREHENSIVE METABOLIC PANEL
ALK PHOS: 66 U/L (ref 39–117)
ALT: 20 U/L (ref 0–35)
AST: 21 U/L (ref 0–37)
Albumin: 3.8 g/dL (ref 3.5–5.2)
BILIRUBIN TOTAL: 0.4 mg/dL (ref 0.2–1.2)
BUN: 14 mg/dL (ref 6–23)
CHLORIDE: 106 meq/L (ref 96–112)
CO2: 28 meq/L (ref 19–32)
CREATININE: 0.81 mg/dL (ref 0.40–1.20)
Calcium: 9.6 mg/dL (ref 8.4–10.5)
GFR: 73.54 mL/min (ref >60.00)
GLUCOSE: 96 mg/dL (ref 70–99)
POTASSIUM: 4.4 meq/L (ref 3.5–5.1)
SODIUM: 139 meq/L (ref 135–145)
Total Protein: 7.2 g/dL (ref 6.0–8.3)

## 2014-04-24 LAB — LIPID PANEL
CHOL/HDL RATIO: 5
Cholesterol: 212 mg/dL — ABNORMAL HIGH (ref 0–200)
HDL: 40.3 mg/dL (ref >39.00)
LDL Cholesterol: 153 mg/dL — ABNORMAL HIGH (ref 0–99)
NonHDL: 171.7
Triglycerides: 96 mg/dL (ref 0.0–149.0)
VLDL: 19.2 mg/dL (ref 0.0–40.0)

## 2014-04-24 LAB — CBC WITH DIFFERENTIAL/PLATELET
Basophils Absolute: 0 10*3/uL (ref 0.0–0.1)
Basophils Relative: 0.8 % (ref 0.0–3.0)
EOS PCT: 3.3 % (ref 0.0–5.0)
Eosinophils Absolute: 0.2 10*3/uL (ref 0.0–0.7)
HEMATOCRIT: 41.6 % (ref 36.0–46.0)
Hemoglobin: 13.6 g/dL (ref 12.0–15.0)
Lymphocytes Relative: 26.3 % (ref 12.0–46.0)
Lymphs Abs: 1.6 10*3/uL (ref 0.7–4.0)
MCHC: 32.7 g/dL (ref 30.0–36.0)
MCV: 89.5 fl (ref 78.0–100.0)
MONOS PCT: 7.4 % (ref 3.0–12.0)
Monocytes Absolute: 0.5 10*3/uL (ref 0.1–1.0)
Neutro Abs: 3.8 10*3/uL (ref 1.4–7.7)
Neutrophils Relative %: 62.2 % (ref 43.0–77.0)
PLATELETS: 227 10*3/uL (ref 150.0–400.0)
RBC: 4.64 Mil/uL (ref 3.87–5.11)
RDW: 13.6 % (ref 11.5–15.5)
WBC: 6.1 10*3/uL (ref 4.0–10.5)

## 2014-04-24 LAB — TSH: TSH: 1.04 u[IU]/mL (ref 0.35–4.50)

## 2014-04-24 LAB — VITAMIN D 25 HYDROXY (VIT D DEFICIENCY, FRACTURES): VITD: 42.09 ng/mL (ref 30.00–100.00)

## 2014-04-25 ENCOUNTER — Encounter: Payer: Self-pay | Admitting: *Deleted

## 2014-05-01 ENCOUNTER — Encounter: Payer: Medicare HMO | Admitting: Family Medicine

## 2014-05-08 ENCOUNTER — Other Ambulatory Visit: Payer: Self-pay | Admitting: *Deleted

## 2014-05-08 MED ORDER — LEVOTHYROXINE SODIUM 75 MCG PO TABS: 75.0000 ug | ORAL_TABLET | Freq: Every day | ORAL | Status: DC

## 2014-05-08 NOTE — Telephone Encounter (Signed)
Patient left a voicemail requesting that a script for her thyroid medication be sent to [facility]. Prescription sent in electronically. Patient notified by telephone.

## 2014-07-19 ENCOUNTER — Telehealth: Payer: Self-pay | Admitting: Family Medicine

## 2014-07-19 NOTE — Telephone Encounter (Signed)
Doesn't need extra labs.  Thanks.

## 2014-07-19 NOTE — Telephone Encounter (Signed)
Called and spoken to patient. Notified patient of Dr Josefine Class comments. Patient verbalized understanding.

## 2014-07-19 NOTE — Telephone Encounter (Signed)
Patient has appointment for a physical on 07/25/14.  Patient had lab work done in January.  Patient wants to know if she needs to have labs done again.  Please call her back at home or on her cell phone.

## 2014-07-25 ENCOUNTER — Other Ambulatory Visit: Payer: Self-pay

## 2014-07-25 ENCOUNTER — Encounter: Payer: Self-pay | Admitting: Family Medicine

## 2014-07-25 ENCOUNTER — Ambulatory Visit (INDEPENDENT_AMBULATORY_CARE_PROVIDER_SITE_OTHER): Payer: Medicare HMO | Admitting: Family Medicine

## 2014-07-25 VITALS — BP 110/60 | HR 77 | Temp 98.3°F | Ht 63.0 in | Wt 148.0 lb

## 2014-07-25 DIAGNOSIS — Z23 Encounter for immunization: Secondary | ICD-10-CM | POA: Diagnosis not present

## 2014-07-25 DIAGNOSIS — Z0000 Encounter for general adult medical examination without abnormal findings: Secondary | ICD-10-CM | POA: Diagnosis not present

## 2014-07-25 DIAGNOSIS — Z1231 Encounter for screening mammogram for malignant neoplasm of breast: Secondary | ICD-10-CM

## 2014-07-25 DIAGNOSIS — Z7189 Other specified counseling: Secondary | ICD-10-CM

## 2014-07-25 DIAGNOSIS — E039 Hypothyroidism, unspecified: Secondary | ICD-10-CM

## 2014-07-25 MED ORDER — LEVOTHYROXINE SODIUM 75 MCG PO TABS: 75.0000 ug | ORAL_TABLET | Freq: Every day | ORAL | Status: DC

## 2014-07-25 NOTE — Progress Notes (Signed)
Pre visit review using our clinic review tool, if applicable. No additional management support is needed unless otherwise documented below in the visit note.  I have personally reviewed the Medicare Annual Wellness questionnaire and have noted 1. The patient's medical and social history 2. Their use of alcohol, tobacco or illicit drugs 3. Their current medications and supplements 4. The patient's functional ability including ADL's, fall risks, home safety risks and hearing or visual             impairment. 5. Diet and physical activities 6. Evidence for depression or mood disorders  The patients weight, height, BMI have been recorded in the chart and visual acuity is per eye clinic.  I have made referrals, counseling and provided education to the patient based review of the above and I have provided the pt with a written personalized care plan for preventive services.  Provider list updated- see scanned forms.  Routine anticipatory guidance given to patient.  See health maintenance.  Flu prev done, encouraged Shingles d/w pt.  PNA 2013 Tetanus 2009 Colonoscopy 2009 Breast cancer screening up to date Advance directive d/w pt.  Son or daughter equally designated if patient were incapacitated.  DXA done 2015, consider recheck in a few years.   Cognitive function addressed- see scanned forms- and if abnormal then additional documentation follows.   Hypothyroid.  TSH wnl, no neck mass, no ADE on meds.  Compliant.   PMH and SH reviewed  Meds, vitals, and allergies reviewed.   ROS: See HPI.  Otherwise negative.    GEN: nad, alert and oriented HEENT: mucous membranes moist NECK: supple w/o LA, no TMG CV: rrr. PULM: ctab, no inc wob ABD: soft, +bs EXT: no edema SKIN: no acute rash

## 2014-07-25 NOTE — Patient Instructions (Addendum)
Check with your insurance to see if they will cover the shingles shot. Take care, glad to see you.   Recheck in about 1 year.  I would get a flu shot each fall.

## 2014-07-26 DIAGNOSIS — Z7189 Other specified counseling: Secondary | ICD-10-CM | POA: Insufficient documentation

## 2014-07-26 NOTE — Assessment & Plan Note (Signed)
Flu prev done, encouraged Shingles d/w pt.  PNA 2013 Tetanus 2009 Colonoscopy 2009 Breast cancer screening up to date Advance directive d/w pt.  Son or daughter equally designated if patient were incapacitated.  DXA done 2015, consider recheck in a few years.   Cognitive function addressed- see scanned forms- and if abnormal then additional documentation follows.  Labs d/w pt.  Wouldn't start statin, continue current meds and work on diet and exercise.  She agrees.

## 2014-07-26 NOTE — Assessment & Plan Note (Signed)
tsh wnl, continue as is.  D/w pt.

## 2014-08-03 ENCOUNTER — Ambulatory Visit
Admission: RE | Admit: 2014-08-03 | Discharge: 2014-08-03 | Disposition: A | Discharge status: Home / Self Care | Payer: Medicare HMO | Source: Ambulatory Visit

## 2014-08-03 DIAGNOSIS — Z1231 Encounter for screening mammogram for malignant neoplasm of breast: Secondary | ICD-10-CM

## 2014-08-04 ENCOUNTER — Encounter: Payer: Self-pay | Admitting: *Deleted

## 2014-09-28 ENCOUNTER — Encounter: Payer: Self-pay | Admitting: Internal Medicine

## 2015-01-09 ENCOUNTER — Encounter: Payer: Self-pay | Admitting: Family Medicine

## 2015-01-09 ENCOUNTER — Ambulatory Visit (INDEPENDENT_AMBULATORY_CARE_PROVIDER_SITE_OTHER): Payer: Medicare HMO | Admitting: Family Medicine

## 2015-01-09 ENCOUNTER — Telehealth: Payer: Self-pay | Admitting: Family Medicine

## 2015-01-09 VITALS — BP 102/56 | HR 87 | Temp 98.0°F | Wt 150.0 lb

## 2015-01-09 DIAGNOSIS — H8110 Benign paroxysmal vertigo, unspecified ear: Secondary | ICD-10-CM

## 2015-01-09 MED ORDER — MECLIZINE HCL 25 MG PO TABS: 12.5000 mg | ORAL_TABLET | Freq: Three times a day (TID) | ORAL | Status: DC | PRN

## 2015-01-09 NOTE — Telephone Encounter (Signed)
Will see today. Thanks

## 2015-01-09 NOTE — Telephone Encounter (Signed)
Patient Name: Valerie Ho DOB: 05-Sep-1940 Initial Comment Caller states she is having dizziness. Especially when she lays down, or looks up. Vertigo she thinks. Nurse Assessment Nurse: Marcelline Deist, RN, Lynda Date/Time (Eastern Time): 01/09/2015 9:02:51 AM Confirm and document reason for call. If symptomatic, describe symptoms. ---Caller states she is having dizziness., especially when she lays down, or looks up. She thinks it could be vertigo. Symptoms since Saturday. Functions OK during the day, but any sudden movements bring this on. Has the patient traveled out of the country within the last 30 days? ---Not Applicable Does the patient have any new or worsening symptoms? ---Yes Will a triage be completed? ---Yes Related visit to physician within the last 2 weeks? ---No Does the PT have any chronic conditions? (i.e. diabetes, asthma, etc.) ---Yes List chronic conditions. ---on thyroid rx Guidelines Guideline Title Affirmed Question Affirmed Notes Dizziness - Vertigo [1] MODERATE dizziness (e.g., vertigo; feels very unsteady, interferes with normal activities) AND [2] has NOT been evaluated by physician for this Final Disposition User See Physician within [place], RN, Kermit Balo Comments Caller is requesting to have flu shot given while she is seen in the office this afternoon. Referrals REFERRED TO PCP OFFICE Disagree/Comply: Comply

## 2015-01-09 NOTE — Patient Instructions (Signed)
Use meclizine and the bedside exercise. Likely benign positional vertigo.  Should get better.  Take care.  Glad to see you.

## 2015-01-09 NOTE — Telephone Encounter (Signed)
Pt has appt 01/09/15 at 3 pm with Dr Damita Dunnings.

## 2015-01-09 NOTE — Progress Notes (Signed)
Room spinning, quick onset, when she sat up in the bed 4 days ago.  She is clearly better today.  Sx come and go.  She had some trouble with leaning her head back, with laying down or turning over in bed.  Some nausea with the episodes.  No presyncope.  No weakness.  No FCV.  No ear pain.  Tinnitus at baseline, no new hearing loss.    Meds, vitals, and allergies reviewed.   ROS: See HPI.  Otherwise, noncontributory.  GEN: nad, alert and oriented HEENT: mucous membranes moist, tm wnl NECK: supple w/o LA CV: rrr.  PULM: ctab, no inc wob ABD: soft, +bs EXT: no edema CN 2-12 wnl B, S/S/DTR wnl x4 Sx reproduced with leaning head backward but not with eye tracking.

## 2015-01-10 DIAGNOSIS — H8110 Benign paroxysmal vertigo, unspecified ear: Secondary | ICD-10-CM | POA: Insufficient documentation

## 2015-01-10 NOTE — Assessment & Plan Note (Signed)
Likely, d/w pt re: ddx.  No ominous sx.  Use bedside exercise and meclizine prn.  She agrees.  See AVS.  Okay for outpatient f/u.

## 2015-02-07 ENCOUNTER — Ambulatory Visit (INDEPENDENT_AMBULATORY_CARE_PROVIDER_SITE_OTHER): Payer: Medicare HMO | Admitting: Family Medicine

## 2015-02-07 ENCOUNTER — Encounter: Payer: Self-pay | Admitting: Family Medicine

## 2015-02-07 VITALS — BP 110/68 | HR 74 | Temp 98.3°F | Wt 151.0 lb

## 2015-02-07 DIAGNOSIS — R300 Dysuria: Secondary | ICD-10-CM

## 2015-02-07 DIAGNOSIS — N3090 Cystitis, unspecified without hematuria: Secondary | ICD-10-CM | POA: Diagnosis not present

## 2015-02-07 LAB — POCT URINALYSIS DIPSTICK
Bilirubin, UA: NEGATIVE
Glucose, UA: NEGATIVE
KETONES UA: NEGATIVE
Nitrite, UA: NEGATIVE
PROTEIN UA: NEGATIVE
Spec Grav, UA: <=1.005
Urobilinogen, UA: 0.2
pH, UA: 6

## 2015-02-07 MED ORDER — SULFAMETHOXAZOLE-TRIMETHOPRIM 800-160 MG PO TABS: 1.0000 | ORAL_TABLET | Freq: Two times a day (BID) | ORAL | Status: DC

## 2015-02-07 NOTE — Patient Instructions (Signed)
Drink plenty of water and start the antibiotics today.  We'll contact you with your lab report.  Take care.  Glad to see you.  

## 2015-02-07 NOTE — Progress Notes (Signed)
Pre visit review using our clinic review tool, if applicable. No additional management support is needed unless otherwise documented below in the visit note.  Dysuria:yes, worse last night, this AM.  Frequency with less UOP per void.  Pain with urination.   duration of symptoms: since yesterday abdominal pain: no Fevers: no back pain: not that it hurt "but my back felt tired."  Vomiting: no U/a d/w pt.   ucx pending.   Meds, vitals, and allergies reviewed.   ROS: See HPI.  Otherwise negative.    GEN: nad, alert and oriented HEENT: mucous membranes moist NECK: supple CV: rrr.  PULM: ctab, no inc wob ABD: soft, +bs, suprapubic area not tender EXT: no edema BACK: no CVA pain

## 2015-02-08 DIAGNOSIS — N3090 Cystitis, unspecified without hematuria: Secondary | ICD-10-CM | POA: Insufficient documentation

## 2015-02-08 NOTE — Assessment & Plan Note (Signed)
Nontoxic, septra, ucx, PO fluids, f/u prn.  She agrees.  Okay for outpatient f/u.

## 2015-02-09 LAB — URINE CULTURE: Colony Count: >=100000

## 2015-03-15 DIAGNOSIS — H2513 Age-related nuclear cataract, bilateral: Secondary | ICD-10-CM | POA: Diagnosis not present

## 2015-03-15 DIAGNOSIS — H25013 Cortical age-related cataract, bilateral: Secondary | ICD-10-CM | POA: Diagnosis not present

## 2015-04-10 DIAGNOSIS — M67911 Unspecified disorder of synovium and tendon, right shoulder: Secondary | ICD-10-CM | POA: Diagnosis not present

## 2015-04-10 DIAGNOSIS — M25511 Pain in right shoulder: Secondary | ICD-10-CM | POA: Diagnosis not present

## 2015-07-10 ENCOUNTER — Encounter: Payer: Self-pay | Admitting: Internal Medicine

## 2015-07-10 ENCOUNTER — Ambulatory Visit (INDEPENDENT_AMBULATORY_CARE_PROVIDER_SITE_OTHER): Payer: Medicare HMO | Admitting: Internal Medicine

## 2015-07-10 VITALS — BP 122/74 | HR 100 | Temp 99.5°F | Wt 153.0 lb

## 2015-07-10 DIAGNOSIS — J301 Allergic rhinitis due to pollen: Secondary | ICD-10-CM | POA: Diagnosis not present

## 2015-07-10 NOTE — Progress Notes (Signed)
HPI  Pt presents to the clinic today with a 2 day h/o productive cough with yellow sputum. She was working in the yard the day before her sxs began. She denies ear pain, nasal congestion, or sore throat. She has tried Copywriter, advertising and OTC cough drops with some relief. She is concerned that she has bronchitis due to her h/o bronchitis (last dx 4 years ago). She denies h/o seasonal allergies. She has had sick contacts.   Review of Systems    Past Medical History  Diagnosis Date  . Hypothyroid   . Lactose intolerance   . Osteopenia   . Compression fracture of L2 ([place]) 2014    after a fall   . Hyperlipidemia   . BCC (basal cell carcinoma of skin)     Family History  Problem Relation Age of Onset  . Heart disease Mother     chf  . Cancer Paternal Uncle   . Diabetes Maternal Grandfather   . Colon cancer Neg Hx   . Breast cancer Neg Hx     Social History   Social History  . Marital Status: Married    Spouse Name: N/A  . Number of Children: N/A  . Years of Education: N/A   Occupational History  . Not on file.   Social History Main Topics  . Smoking status: Never Smoker   . Smokeless tobacco: Never Used  . Alcohol Use: No  . Drug Use: No  . Sexual Activity: Not on file   Other Topics Concern  . Not on file   Social History Narrative   From [place]   Retired from [employer], [place]   Enjoys knitting, reading   Married 1961    Allergies  Allergen Reactions  . Oxycodone Other (See Comments)    vomiting  . Propoxyphene N-Acetaminophen     REACTION: Nausea and Vomiting     Constitutional: Pt reports fever. Denies headache, fatigue or abrupt weight changes.  HEENT:  Denies eye pain, facial pain, nasal congestion and sore throat. Denies eye redness, ear pain, ringing in the ears, wax buildup, runny nose or bloody nose. Respiratory: Positive cough. Denies difficulty breathing or shortness of breath.  Cardiovascular: Denies chest pain, chest tightness,  palpitations or swelling in the hands or feet.   No other specific complaints in a complete review of systems (except as listed in HPI above).  Objective:  BP 122/74 mmHg  Pulse 100  Temp(Src) 99.5 F (37.5 C) (Oral)  Wt 153 lb (69.4 kg)  SpO2 97%   General: Appears her stated age, well developed, well nourished in NAD. HEENT: Head: normal shape and size, no sinus tenderness noted; Eyes: sclera white, no icterus, conjunctiva pink; Ears: Tm's gray and intact, normal light reflex; Nose: mucosa boggy and moist, septum midline; Throat/Mouth: + PND. Teeth present, mucosa pink and moist, no exudate noted, no lesions or ulcerations noted.  Neck:  No adenopathy noted.  Cardiovascular: Tachycardic with normal rhythm. S1,S2 noted.  No murmur, rubs or gallops noted.  Pulmonary/Chest: Normal effort and positive vesicular breath sounds. No respiratory distress. No wheezes, rales or ronchi noted.      Assessment & Plan:   Allergic rhinitis :  Start use of OTC Mucinex x2/day Continue use of cough drops PRN Can start Robitussin PRN  If symptoms dissipate and return in several weeks, begin regimen of Zyrtec in the morning and Flonase at night  RTC as needed or if symptoms persist. Webb Silversmith, NP

## 2015-07-10 NOTE — Patient Instructions (Signed)

## 2015-07-10 NOTE — Progress Notes (Signed)
Pre visit review using our clinic review tool, if applicable. No additional management support is needed unless otherwise documented below in the visit note. 

## 2015-07-29 ENCOUNTER — Other Ambulatory Visit: Payer: Self-pay | Admitting: Family Medicine

## 2015-07-29 DIAGNOSIS — E7800 Pure hypercholesterolemia, unspecified: Secondary | ICD-10-CM

## 2015-07-29 DIAGNOSIS — E039 Hypothyroidism, unspecified: Secondary | ICD-10-CM

## 2015-07-29 DIAGNOSIS — M8580 Other specified disorders of bone density and structure, unspecified site: Secondary | ICD-10-CM

## 2015-07-31 ENCOUNTER — Other Ambulatory Visit: Payer: Self-pay

## 2015-07-31 DIAGNOSIS — Z1231 Encounter for screening mammogram for malignant neoplasm of breast: Secondary | ICD-10-CM

## 2015-08-02 ENCOUNTER — Other Ambulatory Visit (INDEPENDENT_AMBULATORY_CARE_PROVIDER_SITE_OTHER): Payer: Medicare HMO

## 2015-08-02 DIAGNOSIS — E039 Hypothyroidism, unspecified: Secondary | ICD-10-CM

## 2015-08-02 DIAGNOSIS — M8580 Other specified disorders of bone density and structure, unspecified site: Secondary | ICD-10-CM | POA: Diagnosis not present

## 2015-08-02 DIAGNOSIS — E7800 Pure hypercholesterolemia, unspecified: Secondary | ICD-10-CM | POA: Diagnosis not present

## 2015-08-02 LAB — COMPREHENSIVE METABOLIC PANEL
ALBUMIN: 3.9 g/dL (ref 3.5–5.2)
ALK PHOS: 63 U/L (ref 39–117)
ALT: 15 U/L (ref 0–35)
AST: 17 U/L (ref 0–37)
BUN: 12 mg/dL (ref 6–23)
CO2: 29 mEq/L (ref 19–32)
CREATININE: 0.89 mg/dL (ref 0.40–1.20)
Calcium: 9.6 mg/dL (ref 8.4–10.5)
Chloride: 105 mEq/L (ref 96–112)
GFR: 65.74 mL/min (ref >60.00)
GLUCOSE: 95 mg/dL (ref 70–99)
POTASSIUM: 4.1 meq/L (ref 3.5–5.1)
SODIUM: 140 meq/L (ref 135–145)
TOTAL PROTEIN: 7.4 g/dL (ref 6.0–8.3)
Total Bilirubin: 0.4 mg/dL (ref 0.2–1.2)

## 2015-08-02 LAB — TSH: TSH: 2.43 u[IU]/mL (ref 0.35–4.50)

## 2015-08-02 LAB — LIPID PANEL
CHOLESTEROL: 185 mg/dL (ref 0–200)
HDL: 37.9 mg/dL — ABNORMAL LOW (ref >39.00)
LDL CALC: 124 mg/dL — AB (ref 0–99)
NonHDL: 147.16
Total CHOL/HDL Ratio: 5
Triglycerides: 115 mg/dL (ref 0.0–149.0)
VLDL: 23 mg/dL (ref 0.0–40.0)

## 2015-08-02 LAB — VITAMIN D 25 HYDROXY (VIT D DEFICIENCY, FRACTURES): VITD: 32.69 ng/mL (ref 30.00–100.00)

## 2015-08-06 DIAGNOSIS — L820 Inflamed seborrheic keratosis: Secondary | ICD-10-CM | POA: Diagnosis not present

## 2015-08-06 DIAGNOSIS — D485 Neoplasm of uncertain behavior of skin: Secondary | ICD-10-CM | POA: Diagnosis not present

## 2015-08-06 DIAGNOSIS — L821 Other seborrheic keratosis: Secondary | ICD-10-CM | POA: Diagnosis not present

## 2015-08-06 DIAGNOSIS — Z85828 Personal history of other malignant neoplasm of skin: Secondary | ICD-10-CM | POA: Diagnosis not present

## 2015-08-06 DIAGNOSIS — L814 Other melanin hyperpigmentation: Secondary | ICD-10-CM | POA: Diagnosis not present

## 2015-08-06 DIAGNOSIS — C44319 Basal cell carcinoma of skin of other parts of face: Secondary | ICD-10-CM | POA: Diagnosis not present

## 2015-08-07 ENCOUNTER — Ambulatory Visit (INDEPENDENT_AMBULATORY_CARE_PROVIDER_SITE_OTHER): Payer: Medicare HMO | Admitting: Family Medicine

## 2015-08-07 ENCOUNTER — Encounter: Payer: Self-pay | Admitting: Family Medicine

## 2015-08-07 VITALS — BP 124/68 | HR 85 | Temp 98.1°F | Ht 63.0 in | Wt 151.5 lb

## 2015-08-07 DIAGNOSIS — Z0000 Encounter for general adult medical examination without abnormal findings: Secondary | ICD-10-CM | POA: Diagnosis not present

## 2015-08-07 MED ORDER — LEVOTHYROXINE SODIUM 75 MCG PO TABS: 75.0000 ug | ORAL_TABLET | Freq: Every day | ORAL | Status: DC

## 2015-08-07 NOTE — Assessment & Plan Note (Signed)
Flu encouraged Shingles encouraged PNA up to date Tetanus up to date Colonoscopy 2009 Breast cancer screening- mammogram pending per patient report DXA declined for now- she wouldn't want tx and DXA wouldn't change mgmt now.   Advance directive- would have both of her kids designated equally if patient were incapacitated.  Cognitive function addressed- see scanned forms- and if abnormal then additional documentation follows.  Hypothyroidism.  Labs d/wpt.  Compliant.   Labs d/w pt.  Continue as is with meds and diet/exercise.  She agrees.

## 2015-08-07 NOTE — Progress Notes (Signed)
Pre visit review using our clinic review tool, if applicable. No additional management support is needed unless otherwise documented below in the visit note.  I have personally reviewed the Medicare Annual Wellness questionnaire and have noted 1. The patient's medical and social history 2. Their use of alcohol, tobacco or illicit drugs 3. Their current medications and supplements 4. The patient's functional ability including ADL's, fall risks, home safety risks and hearing or visual             impairment. 5. Diet and physical activities 6. Evidence for depression or mood disorders  The patients weight, height, BMI have been recorded in the chart and visual acuity is per eye clinic.  I have made referrals, counseling and provided education to the patient based review of the above and I have provided the pt with a written personalized care plan for preventive services.  Provider list updated- see scanned forms.  Routine anticipatory guidance given to patient.  See health maintenance.  Flu encouraged Shingles encouraged PNA up to date Tetanus up to date Colonoscopy 2009 Breast cancer screening- mammogram pending per patient report DXA declined for now- she wouldn't want tx and DXA wouldn't change mgmt now.   Advance directive- would have both of her kids designated equally if patient were incapacitated.  Cognitive function addressed- see scanned forms- and if abnormal then additional documentation follows.  Hypothyroidism.  Labs d/wpt.  Compliant.    PMH and SH reviewed  Meds, vitals, and allergies reviewed.   ROS: Per HPI.  Unless specifically indicated otherwise in HPI, the patient denies:  General: fever. Eyes: acute vision changes ENT: sore throat Cardiovascular: chest pain Respiratory: SOB GI: vomiting GU: dysuria Musculoskeletal: acute back pain Derm: acute rash- she has seen derm recently for routine check Neuro: acute motor dysfunction Psych: worsening  mood Endocrine: polydipsia Heme: bleeding Allergy: hayfever  GEN: nad, alert and oriented HEENT: mucous membranes moist NECK: supple w/o LA, no tmg CV: rrr. PULM: ctab, no inc wob ABD: soft, +bs EXT: no edema SKIN: no acute rash

## 2015-08-07 NOTE — Patient Instructions (Addendum)
Check with your insurance to see if they will cover the shingles shot. Take care.  Glad to see you.  Recheck next year at a physical.

## 2015-08-17 ENCOUNTER — Telehealth: Payer: Self-pay

## 2015-08-17 ENCOUNTER — Ambulatory Visit: Payer: Medicare HMO

## 2015-08-17 NOTE — Telephone Encounter (Signed)
Pt said [facility] [address] did not have refill for levothyroxine; spoke with Colletta Maryland at [facility] and rx on hold and will get ready for pick up. Pt voiced understanding.

## 2015-09-05 ENCOUNTER — Ambulatory Visit
Admission: RE | Admit: 2015-09-05 | Discharge: 2015-09-05 | Disposition: A | Discharge status: Home / Self Care | Payer: Medicare HMO | Source: Ambulatory Visit

## 2015-09-05 DIAGNOSIS — Z1231 Encounter for screening mammogram for malignant neoplasm of breast: Secondary | ICD-10-CM | POA: Diagnosis not present

## 2015-09-17 DIAGNOSIS — Z85828 Personal history of other malignant neoplasm of skin: Secondary | ICD-10-CM | POA: Diagnosis not present

## 2015-12-18 DIAGNOSIS — L739 Follicular disorder, unspecified: Secondary | ICD-10-CM | POA: Diagnosis not present

## 2015-12-18 DIAGNOSIS — L578 Other skin changes due to chronic exposure to nonionizing radiation: Secondary | ICD-10-CM | POA: Diagnosis not present

## 2015-12-18 DIAGNOSIS — Z85828 Personal history of other malignant neoplasm of skin: Secondary | ICD-10-CM | POA: Diagnosis not present

## 2015-12-18 DIAGNOSIS — L708 Other acne: Secondary | ICD-10-CM | POA: Diagnosis not present

## 2016-01-01 DIAGNOSIS — R69 Illness, unspecified: Secondary | ICD-10-CM | POA: Diagnosis not present

## 2016-01-02 ENCOUNTER — Encounter: Payer: Self-pay | Admitting: Family Medicine

## 2016-01-02 ENCOUNTER — Ambulatory Visit (INDEPENDENT_AMBULATORY_CARE_PROVIDER_SITE_OTHER): Payer: Medicare HMO | Admitting: Family Medicine

## 2016-01-02 VITALS — BP 110/62 | HR 86 | Temp 98.7°F | Wt 151.0 lb

## 2016-01-02 DIAGNOSIS — J069 Acute upper respiratory infection, unspecified: Secondary | ICD-10-CM

## 2016-01-02 DIAGNOSIS — R062 Wheezing: Secondary | ICD-10-CM | POA: Diagnosis not present

## 2016-01-02 MED ORDER — PREDNISONE 20 MG PO TABS: 40.0000 mg | ORAL_TABLET | Freq: Every day | ORAL | 0 refills | Status: DC

## 2016-01-02 MED ORDER — AZITHROMYCIN 250 MG PO TABS: ORAL_TABLET | ORAL | 0 refills | Status: DC

## 2016-01-02 MED ORDER — ALBUTEROL SULFATE HFA 108 (90 BASE) MCG/ACT IN AERS: 2.0000 | INHALATION_SPRAY | RESPIRATORY_TRACT | 1 refills | Status: DC | PRN

## 2016-01-02 MED ORDER — ALBUTEROL SULFATE (2.5 MG/3ML) 0.083% IN NEBU
2.5000 mg | INHALATION_SOLUTION | Freq: Once | RESPIRATORY_TRACT | Status: AC
  Administered 2016-01-02: 2.5 mg via RESPIRATORY_TRACT

## 2016-01-02 MED ORDER — IPRATROPIUM BROMIDE 0.02 % IN SOLN
0.5000 mg | Freq: Once | RESPIRATORY_TRACT | Status: AC
  Administered 2016-01-03: 0.5 mg via RESPIRATORY_TRACT

## 2016-01-02 NOTE — Progress Notes (Signed)
Subjective:    Patient ID: Valerie Ho, female    DOB: 09/01/1940, 75 y.o.   MRN: [id number]  HPI This is a 75 yo female who presents today with cough x 4 days. Some yellow sputum. Clear watery nasal drainage, no ear pain, no sore throat. Doesn't feel bad. Chest feels tight and she has some wheezing. Subjective temperature yesterday. Has history of allergies for many years. No asthma history. Has taken some Mucinex. Sleeping ok. Had a similar episode about 4 years ago and was treated with "some myacin."   Past Medical History:  Diagnosis Date  . BCC (basal cell carcinoma of skin)   . Compression fracture of L2 ([place]) 2014   after a fall   . Hyperlipidemia   . Hypothyroid   . Lactose intolerance   . Osteopenia    Past Surgical History:  Procedure Laterality Date  . BACK SURGERY  1988   lumbar disc surgery   Family History  Problem Relation Age of Onset  . Heart disease Mother     chf  . Cancer Paternal Uncle   . Diabetes Maternal Grandfather   . Colon cancer Neg Hx   . Breast cancer Neg Hx    Social History  Substance Use Topics  . Smoking status: Never Smoker  . Smokeless tobacco: Never Used  . Alcohol use No      Review of Systems Per HPI    Objective:   Physical Exam  Constitutional: She is oriented to person, place, and time. She appears well-developed and well-nourished. No distress.  HENT:  Right Ear: Tympanic membrane, external ear and ear canal normal.  Left Ear: Tympanic membrane, external ear and ear canal normal.  Nose: Nose normal.  Mouth/Throat: Oropharynx is clear and moist.  Eyes: Conjunctivae are normal.  Neck: Normal range of motion. Neck supple.  Cardiovascular: Normal rate, regular rhythm and normal heart sounds.   Pulmonary/Chest: Effort normal.  Pre nebulizer, patient with frequent cough with audible wheezes. Post nebulizer, scattered rhonchi mid fields posteriorly.   Musculoskeletal: She exhibits no edema.  Neurological: She is alert  and oriented to person, place, and time.  Skin: Skin is warm and dry. She is not diaphoretic.  Psychiatric: She has a normal mood and affect. Her behavior is normal. Judgment and thought content normal.  Vitals reviewed.        BP 110/62   Pulse 86   Temp 98.7 F (37.1 C)   Wt 151 lb (68.5 kg)   SpO2 93%   BMI 26.75 kg/m  Wt Readings from Last 3 Encounters:  01/02/16 151 lb (68.5 kg)  08/07/15 151 lb 8 oz (68.7 kg)  07/10/15 153 lb (69.4 kg)   Given atrovent/albuterol nebulizer in office with subjective and objective improvement of chest tightness and cough. Recheck pulse ok 97% Assessment & Plan:  1. Upper respiratory infection with cough and congestion - Provided written and verbal information regarding diagnosis and treatment. - azithromycin (ZITHROMAX) 250 MG tablet; Take two tablets today then one tablet daily until finished  Dispense: 6 tablet; Refill: 0 - predniSONE (DELTASONE) 20 MG tablet; Take 2 tablets (40 mg total) by mouth daily with breakfast.  Dispense: 10 tablet; Refill: 0  2. Wheeze - albuterol (PROVENTIL HFA;VENTOLIN HFA) 108 (90 Base) MCG/ACT inhaler; Inhale 2 puffs into the lungs every 4 (four) hours as needed for wheezing or shortness of breath (cough, shortness of breath or wheezing.).  Dispense: 1 Inhaler; Refill: 1 - albuterol (PROVENTIL) (2.5 MG/3ML)  0.083% nebulizer solution 2.5 mg; Take 3 mLs (2.5 mg total) by nebulization once. - ipratropium (ATROVENT) nebulizer solution 0.5 mg; Take 2.5 mLs (0.5 mg total) by nebulization once. - predniSONE (DELTASONE) 20 MG tablet; Take 2 tablets (40 mg total) by mouth daily with breakfast.  Dispense: 10 tablet; Refill: 0  - RTC precautions reviewed  Clarene Reamer, FNP-BC  [facility]  01/02/2016 9:07 AM

## 2016-01-02 NOTE — Patient Instructions (Addendum)
Please take an over the counter long acting antihistamine such as Claritn or Zyrtec (generic is fine) daily as needed for watery runny nost Use your albuterol inhaler every 2-4 hours while you are awake today and tomorrow then every 2-4 hours as needed for cough, chest tightness, wheeze Over the counter Delsym is good for cough and continue the Mucinex Drink enough fluids until your urine is light yellow Go to the emergency room if you develop any difficulty breathing Let us know if you are not better in 5-7 days, sooner if you are worse.    How to Use an Inhaler Proper inhaler technique is very important. Good technique ensures that the medicine reaches the lungs. Poor technique results in depositing the medicine on the tongue and back of the throat rather than in the airways. If you do not use the inhaler with good technique, the medicine will not help you. STEPS TO FOLLOW IF USING AN INHALER WITHOUT AN EXTENSION TUBE 1. Remove the cap from the inhaler. 2. If you are using the inhaler for the first time, you will need to prime it. Shake the inhaler for 5 seconds and release four puffs into the air, away from your face. Ask your health care provider or pharmacist if you have questions about priming your inhaler. 3. Shake the inhaler for 5 seconds before each breath in (inhalation). 4. Position the inhaler so that the top of the canister faces up. 5. Put your index finger on the top of the medicine canister. Your thumb supports the bottom of the inhaler. 6. Open your mouth. 7. Either place the inhaler between your teeth and place your lips tightly around the mouthpiece, or hold the inhaler 1-2 inches away from your open mouth. If you are unsure of which technique to use, ask your health care provider. 8. Breathe out (exhale) normally and as completely as possible. 9. Press the canister down with your index finger to release the medicine. 10. At the same time as the canister is pressed, inhale  deeply and slowly until your lungs are completely filled. This should take 4-6 seconds. Keep your tongue down. 11. Hold the medicine in your lungs for 5-10 seconds (10 seconds is best). This helps the medicine get into the small airways of your lungs. 12. Breathe out slowly, through pursed lips. Whistling is an example of pursed lips. 13. Wait at least 15-30 seconds between puffs. Continue with the above steps until you have taken the number of puffs your health care provider has ordered. Do not use the inhaler more than your health care provider tells you. 14. Replace the cap on the inhaler. 15. Follow the directions from your health care provider or the inhaler insert for cleaning the inhaler. STEPS TO FOLLOW IF USING AN INHALER WITH AN EXTENSION (SPACER) 1. Remove the cap from the inhaler. 2. If you are using the inhaler for the first time, you will need to prime it. Shake the inhaler for 5 seconds and release four puffs into the air, away from your face. Ask your health care provider or pharmacist if you have questions about priming your inhaler. 3. Shake the inhaler for 5 seconds before each breath in (inhalation). 4. Place the open end of the spacer onto the mouthpiece of the inhaler. 5. Position the inhaler so that the top of the canister faces up and the spacer mouthpiece faces you. 6. Put your index finger on the top of the medicine canister. Your thumb supports the bottom of  the inhaler and the spacer. 7. Breathe out (exhale) normally and as completely as possible. 8. Immediately after exhaling, place the spacer between your teeth and into your mouth. Close your lips tightly around the spacer. 9. Press the canister down with your index finger to release the medicine. 10. At the same time as the canister is pressed, inhale deeply and slowly until your lungs are completely filled. This should take 4-6 seconds. Keep your tongue down and out of the way. 11. Hold the medicine in your lungs for  5-10 seconds (10 seconds is best). This helps the medicine get into the small airways of your lungs. Exhale. 12. Repeat inhaling deeply through the spacer mouthpiece. Again hold that breath for up to 10 seconds (10 seconds is best). Exhale slowly. If it is difficult to take this second deep breath through the spacer, breathe normally several times through the spacer. Remove the spacer from your mouth. 13. Wait at least 15-30 seconds between puffs. Continue with the above steps until you have taken the number of puffs your health care provider has ordered. Do not use the inhaler more than your health care provider tells you. 14. Remove the spacer from the inhaler, and place the cap on the inhaler. 15. Follow the directions from your health care provider or the inhaler insert for cleaning the inhaler and spacer. If you are using different kinds of inhalers, use your quick relief medicine to open the airways 10-15 minutes before using a steroid if instructed to do so by your health care provider. If you are unsure which inhalers to use and the order of using them, ask your health care provider, nurse, or respiratory therapist. If you are using a steroid inhaler, always rinse your mouth with water after your last puff, then gargle and spit out the water. Do not swallow the water. AVOID:  Inhaling before or after starting the spray of medicine. It takes practice to coordinate your breathing with triggering the spray.  Inhaling through the nose (rather than the mouth) when triggering the spray. HOW TO DETERMINE IF YOUR INHALER IS FULL OR NEARLY EMPTY You cannot know when an inhaler is empty by shaking it. A few inhalers are now being made with dose counters. Ask your health care provider for a prescription that has a dose counter if you feel you need that extra help. If your inhaler does not have a counter, ask your health care provider to help you determine the date you need to refill your inhaler. Write the  refill date on a calendar or your inhaler canister. Refill your inhaler 7-10 days before it runs out. Be sure to keep an adequate supply of medicine. This includes making sure it is not expired, and that you have a spare inhaler.  SEEK MEDICAL CARE IF:   Your symptoms are only partially relieved with your inhaler.  You are having trouble using your inhaler.  You have some increase in phlegm. SEEK IMMEDIATE MEDICAL CARE IF:   You feel little or no relief with your inhalers. You are still wheezing and are feeling shortness of breath or tightness in your chest or both.  You have dizziness, headaches, or a fast heart rate.  You have chills, fever, or night sweats.  You have a noticeable increase in phlegm production, or there is blood in the phlegm. MAKE SURE YOU:   Understand these instructions.  Will watch your condition.  Will get help right away if you are not doing well or  get worse.   This information is not intended to replace advice given to you by your health care provider. Make sure you discuss any questions you have with your health care provider.   Document Released: 03/21/2000 Document Revised: 01/12/2013 Document Reviewed: 10/21/2012 Elsevier Interactive Patient Education [employer].

## 2016-01-03 DIAGNOSIS — R062 Wheezing: Secondary | ICD-10-CM | POA: Diagnosis not present

## 2016-01-03 DIAGNOSIS — J069 Acute upper respiratory infection, unspecified: Secondary | ICD-10-CM | POA: Diagnosis not present

## 2016-03-14 ENCOUNTER — Ambulatory Visit (INDEPENDENT_AMBULATORY_CARE_PROVIDER_SITE_OTHER): Payer: Medicare HMO

## 2016-03-14 DIAGNOSIS — Z23 Encounter for immunization: Secondary | ICD-10-CM | POA: Diagnosis not present

## 2016-03-17 DIAGNOSIS — H25013 Cortical age-related cataract, bilateral: Secondary | ICD-10-CM | POA: Diagnosis not present

## 2016-03-17 DIAGNOSIS — H5203 Hypermetropia, bilateral: Secondary | ICD-10-CM | POA: Diagnosis not present

## 2016-06-09 ENCOUNTER — Telehealth: Payer: Self-pay

## 2016-06-09 NOTE — Telephone Encounter (Signed)
I spoke with pt and she said she is not running any fever today and thinks the mucinex is helping; pt does not want to schedule appt but pt will cb if needed. FYI to Dr Damita Dunnings.

## 2016-06-09 NOTE — Telephone Encounter (Signed)
Noted. Thanks.

## 2016-06-09 NOTE — Telephone Encounter (Signed)
PLEASE NOTE: All timestamps contained within this report are represented as [place] Standard Time. CONFIDENTIALTY NOTICE: This fax transmission is intended only for the addressee. It contains information that is legally privileged, confidential or otherwise protected from use or disclosure. If you are not the intended recipient, you are strictly prohibited from reviewing, disclosing, copying using or disseminating any of this information or taking any action in reliance on or regarding this information. If you have received this fax in error, please notify us immediately by telephone so that we can arrange for its return to [place]. Phone: [phone], Toll-Free: [phone], Fax: [phone] Page: 1 of 1 Call Id: [id number] [place] Patient Name: Valerie Ho Gender: Unknown DOB: Mar 08, 1941 Age: 76 Y 27 M 23 D Return Phone Number: [phone] (Primary) City/State/Zip: [place] Client Eva Night - Client Client Site [place] Physician Renford Dills - MD Who Is Calling Patient / Member / Family / Caregiver Call Type Triage / Clinical Caller Name Richard Relationship To Patient Self Return Phone Number [phone] (Primary) Chief Complaint Nasal Allergies (Hay Fever) Reason for Call Symptomatic / Request for Gann thought she was suffering from allergies but it is now turning into something else, she is prone to bronchitis. She is currently Mucinex but wants to know if there is anything else she can take to help with the fever. Nurse Assessment Nurse: Jimmey Ralph, RN, [place] Date/Time (Eastern Time): 06/07/2016 4:33:10 PM Confirm and document reason for call. If symptomatic, describe symptoms. ---Caller states: The symptoms started yesterday. thought she was suffering from allergies (nose running and sneezing)  but it is now turning into something else, she is prone to bronchitis. She is currently Mucinex but wants to know if there is anything else she can take to help with the fever (I think I am but don't know what it is). I have taken benadryl, cold alkaseltzer, mucinex. Does the PT have any chronic conditions? (i.e. diabetes, asthma, etc.) ---No Guidelines Guideline Title Affirmed Question Common Cold Cold with no complications (all triage questions negative) Disp. Time Eilene Ghazi Time) Disposition Final User 06/07/2016 4:39:12 PM Home Care Yes Hammonds, RN, [place] Advice Given Per Guideline HOME CARE: You should be able to treat this at home. REASSURANCE: FOR A STUFFY NOSE - USE NASAL WASHES: * Introduction: Saline (salt water) nasal irrigation (nasal wash) is an effective and simple home remedy for treating stuffy nose and sinus congestion. The nose can be irrigated by pouring, spraying, or squirting salt water into the nose and then letting it run back out. FOR A RUNNY NOSE - BLOW YOUR NOSE: MEDICINES FOR STUFFY OR RUNNY NOSE: HUMIDIFIER: If the air in your home is dry, use a humidifier. EXPECTED COURSE: * Fever 2-3 days * Nasal discharge 7-14 days * Cough 2-3 weeks. CALL BACK IF: * You become short of breath * Fever lasts over 3 days CARE ADVICE given per Colds (Adult) guideline.

## 2016-07-29 ENCOUNTER — Other Ambulatory Visit: Payer: Self-pay | Admitting: Family Medicine

## 2016-07-29 DIAGNOSIS — Z1231 Encounter for screening mammogram for malignant neoplasm of breast: Secondary | ICD-10-CM

## 2016-08-03 ENCOUNTER — Other Ambulatory Visit: Payer: Self-pay | Admitting: Family Medicine

## 2016-08-03 DIAGNOSIS — E7800 Pure hypercholesterolemia, unspecified: Secondary | ICD-10-CM

## 2016-08-03 DIAGNOSIS — E039 Hypothyroidism, unspecified: Secondary | ICD-10-CM

## 2016-08-03 DIAGNOSIS — M8580 Other specified disorders of bone density and structure, unspecified site: Secondary | ICD-10-CM

## 2016-08-08 ENCOUNTER — Other Ambulatory Visit: Payer: Medicare HMO

## 2016-08-08 ENCOUNTER — Ambulatory Visit (INDEPENDENT_AMBULATORY_CARE_PROVIDER_SITE_OTHER): Payer: Medicare HMO

## 2016-08-08 VITALS — BP 102/68 | HR 76 | Temp 97.7°F | Ht 63.0 in | Wt 153.2 lb

## 2016-08-08 DIAGNOSIS — E7800 Pure hypercholesterolemia, unspecified: Secondary | ICD-10-CM

## 2016-08-08 DIAGNOSIS — E039 Hypothyroidism, unspecified: Secondary | ICD-10-CM

## 2016-08-08 DIAGNOSIS — Z0000 Encounter for general adult medical examination without abnormal findings: Secondary | ICD-10-CM | POA: Diagnosis not present

## 2016-08-08 DIAGNOSIS — M8580 Other specified disorders of bone density and structure, unspecified site: Secondary | ICD-10-CM | POA: Diagnosis not present

## 2016-08-08 LAB — COMPREHENSIVE METABOLIC PANEL
ALBUMIN: 3.9 g/dL (ref 3.5–5.2)
ALK PHOS: 65 U/L (ref 39–117)
ALT: 15 U/L (ref 0–35)
AST: 19 U/L (ref 0–37)
BUN: 11 mg/dL (ref 6–23)
CALCIUM: 9 mg/dL (ref 8.4–10.5)
CO2: 28 mEq/L (ref 19–32)
Chloride: 107 mEq/L (ref 96–112)
Creatinine, Ser: 0.94 mg/dL (ref 0.40–1.20)
GFR: 61.55 mL/min (ref >60.00)
Glucose, Bld: 91 mg/dL (ref 70–99)
POTASSIUM: 4.4 meq/L (ref 3.5–5.1)
Sodium: 139 mEq/L (ref 135–145)
TOTAL PROTEIN: 7.1 g/dL (ref 6.0–8.3)
Total Bilirubin: 0.5 mg/dL (ref 0.2–1.2)

## 2016-08-08 LAB — LIPID PANEL
CHOL/HDL RATIO: 5
CHOLESTEROL: 202 mg/dL — AB (ref 0–200)
HDL: 41.3 mg/dL (ref >39.00)
LDL Cholesterol: 138 mg/dL — ABNORMAL HIGH (ref 0–99)
NONHDL: 161.12
Triglycerides: 118 mg/dL (ref 0.0–149.0)
VLDL: 23.6 mg/dL (ref 0.0–40.0)

## 2016-08-08 LAB — VITAMIN D 25 HYDROXY (VIT D DEFICIENCY, FRACTURES): VITD: 44.61 ng/mL (ref 30.00–100.00)

## 2016-08-08 LAB — TSH: TSH: 2.06 u[IU]/mL (ref 0.35–4.50)

## 2016-08-08 NOTE — Progress Notes (Signed)
PCP notes:   Health maintenance:  Mammogram - addressed; future appt scheduled in June 2018  Abnormal screenings:   Hearing - failed  Patient concerns:   None  Nurse concerns:  None  Next PCP appt:   08/12/2016 @ 0945  I reviewed health advisor's note, was available for consultation on the day of service listed in this note, and agree with documentation and plan. Elsie Stain, MD.

## 2016-08-08 NOTE — Progress Notes (Signed)
Subjective:   Valerie Ho is a 76 y.o. female who presents for Medicare Annual (Subsequent) preventive examination.  Review of Systems:  N/A Cardiac Risk Factors include: advanced age (>46men, >65 women);dyslipidemia     Objective:     Vitals: BP 102/68 (BP Location: Right Arm, Patient Position: Sitting, Cuff Size: Normal)   Pulse 76   Temp 97.7 F (36.5 C) (Oral)   Ht 5\' 3"  (1.6 m) Comment: no shoes  Wt 153 lb 4 oz (69.5 kg)   SpO2 98%   BMI 27.15 kg/m   Body mass index is 27.15 kg/m.   Tobacco History  Smoking Status  . Never Smoker  Smokeless Tobacco  . Never Used     Counseling given: No   Past Medical History:  Diagnosis Date  . BCC (basal cell carcinoma of skin)   . Compression fracture of L2 ([facility]) 2014   after a fall   . Hyperlipidemia   . Hypothyroid   . Lactose intolerance   . Osteopenia    Past Surgical History:  Procedure Laterality Date  . BACK SURGERY  1988   lumbar disc surgery   Family History  Problem Relation Age of Onset  . Heart disease Mother     chf  . Cancer Paternal Uncle   . Diabetes Maternal Grandfather   . Colon cancer Neg Hx   . Breast cancer Neg Hx    History  Sexual Activity  . Sexual activity: Not on file    Outpatient Encounter Prescriptions as of 08/08/2016  Medication Sig  . Calcium Carbonate-Vitamin D (CALCIUM-VITAMIN D) 600-200 MG-UNIT CAPS Take 1 tablet by mouth 2 (two) times daily.    . Cholecalciferol (VITAMIN D) 1000 UNITS capsule Take 1,000 Units by mouth daily.    . Coenzyme Q10 (COQ-10) 200 MG CAPS Take 1 capsule by mouth daily.  . Ibuprofen (ADVIL) 200 MG CAPS Take 1-2 capsules by mouth at bedtime as needed.  Marland Kitchen levothyroxine (SYNTHROID, LEVOTHROID) 75 MCG tablet Take 1 tablet (75 mcg total) by mouth daily.  . meclizine (ANTIVERT) 25 MG tablet Take 0.5-1 tablets (12.5-25 mg total) by mouth 3 (three) times daily as needed for dizziness.  . Omega-3 Fatty Acids (FISH OIL) 1000 MG CAPS Take 1,000 capsules  by mouth daily.  Marland Kitchen albuterol (PROVENTIL HFA;VENTOLIN HFA) 108 (90 Base) MCG/ACT inhaler Inhale 2 puffs into the lungs every 4 (four) hours as needed for wheezing or shortness of breath (cough, shortness of breath or wheezing.). (Patient not taking: Reported on 08/08/2016)  . [DISCONTINUED] azithromycin (ZITHROMAX) 250 MG tablet Take two tablets today then one tablet daily until finished  . [DISCONTINUED] predniSONE (DELTASONE) 20 MG tablet Take 2 tablets (40 mg total) by mouth daily with breakfast.  . [DISCONTINUED] Red Yeast Rice Extract (RED YEAST RICE PO) Take by mouth daily.   No facility-administered encounter medications on file as of 08/08/2016.     Activities of Daily Living In your present state of health, do you have any difficulty performing the following activities: 08/08/2016  Hearing? Y  Vision? N  Difficulty concentrating or making decisions? N  Walking or climbing stairs? N  Dressing or bathing? N  Doing errands, shopping? N  Preparing Food and eating ? N  Using the Toilet? N  In the past six months, have you accidently leaked urine? Y  Do you have problems with loss of bowel control? N  Managing your Medications? N  Managing your Finances? N  Housekeeping or managing your Housekeeping?  N  Some recent data might be hidden    Patient Care Team: Tonia Ghent, MD as PCP - General (Family Medicine) Katy Apo, MD as Consulting Physician (Ophthalmology)    Assessment:     Hearing Screening   125Hz  250Hz  500Hz  1000Hz  2000Hz  3000Hz  4000Hz  6000Hz  8000Hz   Right ear:   0 0 0  0    Left ear:   0 0 0  0    Vision Screening Comments: Last vision exam in Fall 2017 with Dr. Katy Apo   Exercise Activities and Dietary recommendations Current Exercise Habits: The patient does not participate in regular exercise at present (pt states she walks frequently during daily tasks), Exercise limited by: None identified  Goals    . Increase water intake          Starting 08/08/16,  I will attempt to drink at least 8 oz of water with each meal.       Fall Risk Fall Risk  08/08/2016 08/07/2015 07/25/2014 07/19/2013  Falls in the past year? No No No Yes  Number falls in past yr: - - - 1  Injury with Fall? - - - Yes   Depression Screen PHQ 2/9 Scores 08/08/2016 08/07/2015 07/25/2014 07/19/2013  PHQ - 2 Score 0 0 0 0     Cognitive Function MMSE - Mini Mental State Exam 08/08/2016  Orientation to time 5  Orientation to Place 5  Registration 3  Attention/ Calculation 0  Recall 3  Language- name 2 objects 0  Language- repeat 1  Language- follow 3 step command 3  Language- read & follow direction 0  Write a sentence 0  Copy design 0  Total score 20       PLEASE NOTE: A Mini-Cog screen was completed. Maximum score is 20. A value of 0 denotes this part of [place] MMSE was not completed or the patient failed this part of the Mini-Cog screening.   Mini-Cog Screening Orientation to Time - Max 5 pts Orientation to Place - Max 5 pts Registration - Max 3 pts Recall - Max 3 pts Language Repeat - Max 1 pts Language Follow 3 Step Command - Max 3 pts   Immunization History  Administered Date(s) Administered  . Influenza Split 01/20/2012  . Influenza,inj,Quad PF,36+ Mos 03/08/2013, 03/14/2016  . Pneumococcal Conjugate-13 07/25/2014  . Pneumococcal Polysaccharide-23 01/20/2012  . Td 12/30/2007   Screening Tests Health Maintenance  Topic Date Due  . MAMMOGRAM  09/04/2016  . INFLUENZA VACCINE  11/05/2016  . TETANUS/TDAP  12/29/2017  . COLONOSCOPY  01/27/2018  . DEXA SCAN  Completed  . PNA vac Low Risk Adult  Completed      Plan:     I have personally reviewed and addressed the Medicare Annual Wellness questionnaire and have noted the following in the patient's chart:  A. Medical and social history B. Use of alcohol, tobacco or illicit drugs  C. Current medications and supplements D. Functional ability and status E.  Nutritional status F.  Physical  activity G. Advance directives H. List of other physicians I.  Hospitalizations, surgeries, and ER visits in previous 12 months J.  [place] to include hearing, vision, cognitive, depression L. Referrals and appointments - none  In addition, I have reviewed and discussed with patient certain preventive protocols, quality metrics, and best practice recommendations. A written personalized care plan for preventive services as well as general preventive health recommendations were provided to patient.  See attached scanned questionnaire for additional information.  Signed,   Lindell Noe, MHA, BS, LPN Health Coach

## 2016-08-08 NOTE — Progress Notes (Signed)
Pre visit review using our clinic review tool, if applicable. No additional management support is needed unless otherwise documented below in the visit note. 

## 2016-08-08 NOTE — Patient Instructions (Signed)
Valerie Ho , Thank you for taking time to come for your Medicare Wellness Visit. I appreciate your ongoing commitment to your health goals. Please review the following plan we discussed and let me know if I can assist you in the future.   These are the goals we discussed: Goals    . Increase water intake          Starting 08/08/16, I will attempt to drink at least 8 oz of water with each meal.        This is a list of the screening recommended for you and due dates:  Health Maintenance  Topic Date Due  . Mammogram  09/04/2016  . Flu Shot  11/05/2016  . Tetanus Vaccine  12/29/2017  . Colon Cancer Screening  01/27/2018  . DEXA scan (bone density measurement)  Completed  . Pneumonia vaccines  Completed   Preventive Care for Adults  A healthy lifestyle and preventive care can promote health and wellness. Preventive health guidelines for adults include the following key practices.  . A routine yearly physical is a good way to check with your health care provider about your health and preventive screening. It is a chance to share any concerns and updates on your health and to receive a thorough exam.  . Visit your dentist for a routine exam and preventive care every 6 months. Brush your teeth twice a day and floss once a day. Good oral hygiene prevents tooth decay and gum disease.  . The frequency of eye exams is based on your age, health, family medical history, use  of contact lenses, and other factors. Follow your health care provider's ecommendations for frequency of eye exams.  . Eat a healthy diet. Foods like vegetables, fruits, whole grains, low-fat dairy products, and lean protein foods contain the nutrients you need without too many calories. Decrease your intake of foods high in solid fats, added sugars, and salt. Eat the right amount of calories for you. Get information about a proper diet from your health care provider, if necessary.  . Regular physical exercise is one of the  most important things you can do for your health. Most adults should get at least 150 minutes of moderate-intensity exercise (any activity that increases your heart rate and causes you to sweat) each week. In addition, most adults need muscle-strengthening exercises on 2 or more days a week.  Silver Sneakers may be a benefit available to you. To determine eligibility, you may visit the website: www.silversneakers.com or contact program at 606-381-3227 Mon-Fri between 8AM-8PM.   . Maintain a healthy weight. The body mass index (BMI) is a screening tool to identify possible weight problems. It provides an estimate of body fat based on height and weight. Your health care provider can find your BMI and can help you achieve or maintain a healthy weight.   For adults 20 years and older: ? A BMI below 18.5 is considered underweight. ? A BMI of 18.5 to 24.9 is normal. ? A BMI of 25 to 29.9 is considered overweight. ? A BMI of 30 and above is considered obese.   . Maintain normal blood lipids and cholesterol levels by exercising and minimizing your intake of saturated fat. Eat a balanced diet with plenty of fruit and vegetables. Blood tests for lipids and cholesterol should begin at age 8 and be repeated every 5 years. If your lipid or cholesterol levels are high, you are over 50, or you are at high risk for heart disease,  you may need your cholesterol levels checked more frequently. Ongoing high lipid and cholesterol levels should be treated with medicines if diet and exercise are not working.  . If you smoke, find out from your health care provider how to quit. If you do not use tobacco, please do not start.  . If you choose to drink alcohol, please do not consume more than 2 drinks per day. One drink is considered to be 12 ounces (355 mL) of beer, 5 ounces (148 mL) of wine, or 1.5 ounces (44 mL) of liquor.  . If you are 53-67 years old, ask your health care provider if you should take aspirin to  prevent strokes.  . Use sunscreen. Apply sunscreen liberally and repeatedly throughout the day. You should seek shade when your shadow is shorter than you. Protect yourself by wearing long sleeves, pants, a wide-brimmed hat, and sunglasses year round, whenever you are outdoors.  . Once a month, do a whole body skin exam, using a mirror to look at the skin on your back. Tell your health care provider of new moles, moles that have irregular borders, moles that are larger than a pencil eraser, or moles that have changed in shape or color.

## 2016-08-12 ENCOUNTER — Encounter: Payer: Self-pay | Admitting: Family Medicine

## 2016-08-12 ENCOUNTER — Ambulatory Visit (INDEPENDENT_AMBULATORY_CARE_PROVIDER_SITE_OTHER): Payer: Medicare HMO | Admitting: Family Medicine

## 2016-08-12 VITALS — BP 108/70 | HR 78 | Temp 98.1°F | Ht 63.0 in | Wt 153.0 lb

## 2016-08-12 DIAGNOSIS — Z0000 Encounter for general adult medical examination without abnormal findings: Secondary | ICD-10-CM

## 2016-08-12 DIAGNOSIS — M8580 Other specified disorders of bone density and structure, unspecified site: Secondary | ICD-10-CM

## 2016-08-12 DIAGNOSIS — E7800 Pure hypercholesterolemia, unspecified: Secondary | ICD-10-CM

## 2016-08-12 DIAGNOSIS — E039 Hypothyroidism, unspecified: Secondary | ICD-10-CM

## 2016-08-12 DIAGNOSIS — H8110 Benign paroxysmal vertigo, unspecified ear: Secondary | ICD-10-CM

## 2016-08-12 DIAGNOSIS — Z7189 Other specified counseling: Secondary | ICD-10-CM

## 2016-08-12 MED ORDER — LEVOTHYROXINE SODIUM 75 MCG PO TABS: 75.0000 ug | ORAL_TABLET | Freq: Every day | ORAL | 3 refills | Status: DC

## 2016-08-12 NOTE — Progress Notes (Signed)
Pre visit review using our clinic review tool, if applicable. No additional management support is needed unless otherwise documented below in the visit note. 

## 2016-08-12 NOTE — Patient Instructions (Addendum)
Check with your insurance to see if they will cover the shingles shot. Don't change your meds for now.  Take care.  Glad to see you.  Update me as needed.

## 2016-08-12 NOTE — Assessment & Plan Note (Signed)
Mammogram - addressed; future appt scheduled in June 2018  Hearing - failed, d/w pt.  She has OV pending for hearing aids.

## 2016-08-12 NOTE — Assessment & Plan Note (Signed)
Diet and exercise d/w pt.  Healthy diet.  He is active but not on an steady exercise program- she is going to try to get back to that.  Encouraged.  Minimal inc in TC, d/w pt.  Not on a statin, likely not needed if she continues work on diet and exercise.  >25 minutes spent in face to face time with patient, >50% spent in counselling or coordination of care.

## 2016-08-12 NOTE — Progress Notes (Signed)
Mammogram - addressed; future appt scheduled in June 2018  Hearing - failed, d/w pt.  She has OV pending for hearing aids.    Advance directive- would have both of her kids designated equally if patient were incapacitated.   Osteopenia.  DXA declined for now- she wouldn't want tx and DXA wouldn't change mgmt now.  D/w pt.  Vit D wnl.    Hypothyroidism.   Compliant.  TSH wnl.  Labs d/w pt. No neck mass, no dysphagia.    HLD. Diet and exercise d/w pt.  Healthy diet.  He is active but not on an steady exercise program- she is going to try to get back to that.  Encouraged.  Minimal inc in TC, d/w pt.  Not on a statin, likely not needed if she continues work on diet and exercise.    Vertigo.  Not often with sx.  Meclizine helps.  Sx can last intermittently for a few days.  It gradually got better.  D/w pt about bedside exercises.    PMH and SH reviewed  ROS: Per HPI unless specifically indicated in ROS section   Meds, vitals, and allergies reviewed.

## 2016-08-12 NOTE — Assessment & Plan Note (Signed)
  Advance directive- would have both of her kids designated equally if patient were incapacitated.

## 2016-08-12 NOTE — Assessment & Plan Note (Signed)
D/w pt about bedside exercises and prn meclizine.  Update me as needed.

## 2016-08-12 NOTE — Assessment & Plan Note (Signed)
Compliant.  TSH wnl.  Labs d/w pt. No neck mass, no dysphagia.   Continue as is.

## 2016-08-12 NOTE — Assessment & Plan Note (Signed)
DXA declined for now- she wouldn't want tx and DXA wouldn't change mgmt now.  D/w pt.  Vit D wnl.

## 2016-08-29 DIAGNOSIS — H903 Sensorineural hearing loss, bilateral: Secondary | ICD-10-CM | POA: Diagnosis not present

## 2016-09-02 DIAGNOSIS — M25511 Pain in right shoulder: Secondary | ICD-10-CM | POA: Diagnosis not present

## 2016-09-09 ENCOUNTER — Encounter: Payer: Self-pay | Admitting: *Deleted

## 2016-09-09 ENCOUNTER — Ambulatory Visit
Admission: RE | Admit: 2016-09-09 | Discharge: 2016-09-09 | Disposition: A | Discharge status: Home / Self Care | Payer: Medicare HMO | Source: Ambulatory Visit | Attending: Family Medicine | Admitting: Family Medicine

## 2016-09-09 DIAGNOSIS — Z1231 Encounter for screening mammogram for malignant neoplasm of breast: Secondary | ICD-10-CM

## 2017-01-05 DIAGNOSIS — R69 Illness, unspecified: Secondary | ICD-10-CM | POA: Diagnosis not present

## 2017-01-26 ENCOUNTER — Ambulatory Visit: Payer: Medicare HMO | Admitting: Family Medicine

## 2017-01-26 DIAGNOSIS — Z0289 Encounter for other administrative examinations: Secondary | ICD-10-CM

## 2017-02-03 DIAGNOSIS — M79671 Pain in right foot: Secondary | ICD-10-CM | POA: Diagnosis not present

## 2017-02-06 DIAGNOSIS — Z23 Encounter for immunization: Secondary | ICD-10-CM | POA: Diagnosis not present

## 2017-02-09 DIAGNOSIS — D1801 Hemangioma of skin and subcutaneous tissue: Secondary | ICD-10-CM | POA: Diagnosis not present

## 2017-02-09 DIAGNOSIS — Z85828 Personal history of other malignant neoplasm of skin: Secondary | ICD-10-CM | POA: Diagnosis not present

## 2017-02-09 DIAGNOSIS — D229 Melanocytic nevi, unspecified: Secondary | ICD-10-CM | POA: Diagnosis not present

## 2017-02-09 DIAGNOSIS — L821 Other seborrheic keratosis: Secondary | ICD-10-CM | POA: Diagnosis not present

## 2017-03-18 DIAGNOSIS — H25013 Cortical age-related cataract, bilateral: Secondary | ICD-10-CM | POA: Diagnosis not present

## 2017-03-18 DIAGNOSIS — H5203 Hypermetropia, bilateral: Secondary | ICD-10-CM | POA: Diagnosis not present

## 2017-03-18 DIAGNOSIS — H524 Presbyopia: Secondary | ICD-10-CM | POA: Diagnosis not present

## 2017-08-10 ENCOUNTER — Other Ambulatory Visit: Payer: Self-pay | Admitting: Family Medicine

## 2017-08-10 DIAGNOSIS — Z1231 Encounter for screening mammogram for malignant neoplasm of breast: Secondary | ICD-10-CM

## 2017-08-12 ENCOUNTER — Other Ambulatory Visit: Payer: Self-pay | Admitting: Family Medicine

## 2017-08-12 ENCOUNTER — Ambulatory Visit (INDEPENDENT_AMBULATORY_CARE_PROVIDER_SITE_OTHER): Payer: Medicare HMO

## 2017-08-12 VITALS — BP 108/78 | HR 76 | Temp 98.4°F | Ht 62.75 in | Wt 151.8 lb

## 2017-08-12 DIAGNOSIS — Z0000 Encounter for general adult medical examination without abnormal findings: Secondary | ICD-10-CM | POA: Diagnosis not present

## 2017-08-12 DIAGNOSIS — E7800 Pure hypercholesterolemia, unspecified: Secondary | ICD-10-CM

## 2017-08-12 DIAGNOSIS — M8580 Other specified disorders of bone density and structure, unspecified site: Secondary | ICD-10-CM

## 2017-08-12 DIAGNOSIS — E039 Hypothyroidism, unspecified: Secondary | ICD-10-CM

## 2017-08-12 LAB — LIPID PANEL
CHOL/HDL RATIO: 5
CHOLESTEROL: 181 mg/dL (ref 0–200)
HDL: 39.6 mg/dL (ref >39.00)
LDL Cholesterol: 124 mg/dL — ABNORMAL HIGH (ref 0–99)
NonHDL: 141.57
Triglycerides: 87 mg/dL (ref 0.0–149.0)
VLDL: 17.4 mg/dL (ref 0.0–40.0)

## 2017-08-12 LAB — COMPREHENSIVE METABOLIC PANEL
ALT: 13 U/L (ref 0–35)
AST: 18 U/L (ref 0–37)
Albumin: 3.8 g/dL (ref 3.5–5.2)
Alkaline Phosphatase: 56 U/L (ref 39–117)
BUN: 12 mg/dL (ref 6–23)
CHLORIDE: 104 meq/L (ref 96–112)
CO2: 28 meq/L (ref 19–32)
CREATININE: 0.93 mg/dL (ref 0.40–1.20)
Calcium: 9.1 mg/dL (ref 8.4–10.5)
GFR: 62.15 mL/min (ref >60.00)
Glucose, Bld: 90 mg/dL (ref 70–99)
POTASSIUM: 4.2 meq/L (ref 3.5–5.1)
SODIUM: 138 meq/L (ref 135–145)
Total Bilirubin: 0.4 mg/dL (ref 0.2–1.2)
Total Protein: 7 g/dL (ref 6.0–8.3)

## 2017-08-12 LAB — VITAMIN D 25 HYDROXY (VIT D DEFICIENCY, FRACTURES): VITD: 46.46 ng/mL (ref 30.00–100.00)

## 2017-08-12 LAB — TSH: TSH: 1.48 u[IU]/mL (ref 0.35–4.50)

## 2017-08-12 NOTE — Patient Instructions (Signed)
Ms. Schlink , Thank you for taking time to come for your Medicare Wellness Visit. I appreciate your ongoing commitment to your health goals. Please review the following plan we discussed and let me know if I can assist you in the future.   These are the goals we discussed: Goals    . Increase physical activity     Starting 08/12/2017, I will continue to exercise for 60 minutes 3 days per week.        This is a list of the screening recommended for you and due dates:  Health Maintenance  Topic Date Due  . Mammogram  09/09/2017  . Flu Shot  11/05/2017  . Tetanus Vaccine  12/29/2017  . DEXA scan (bone density measurement)  Completed  . Pneumonia vaccines  Completed   Preventive Care for Adults  A healthy lifestyle and preventive care can promote health and wellness. Preventive health guidelines for adults include the following key practices.  . A routine yearly physical is a good way to check with your health care provider about your health and preventive screening. It is a chance to share any concerns and updates on your health and to receive a thorough exam.  . Visit your dentist for a routine exam and preventive care every 6 months. Brush your teeth twice a day and floss once a day. Good oral hygiene prevents tooth decay and gum disease.  . The frequency of eye exams is based on your age, health, family medical history, use  of contact lenses, and other factors. Follow your health care provider's recommendations for frequency of eye exams.  . Eat a healthy diet. Foods like vegetables, fruits, whole grains, low-fat dairy products, and lean protein foods contain the nutrients you need without too many calories. Decrease your intake of foods high in solid fats, added sugars, and salt. Eat the right amount of calories for you. Get information about a proper diet from your health care provider, if necessary.  . Regular physical exercise is one of the most important things you can do for your  health. Most adults should get at least 150 minutes of moderate-intensity exercise (any activity that increases your heart rate and causes you to sweat) each week. In addition, most adults need muscle-strengthening exercises on 2 or more days a week.  Silver Sneakers may be a benefit available to you. To determine eligibility, you may visit the website: www.silversneakers.com or contact program at 954-267-4529 Mon-Fri between 8AM-8PM.   . Maintain a healthy weight. The body mass index (BMI) is a screening tool to identify possible weight problems. It provides an estimate of body fat based on height and weight. Your health care provider can find your BMI and can help you achieve or maintain a healthy weight.   For adults 20 years and older: ? A BMI below 18.5 is considered underweight. ? A BMI of 18.5 to 24.9 is normal. ? A BMI of 25 to 29.9 is considered overweight. ? A BMI of 30 and above is considered obese.   . Maintain normal blood lipids and cholesterol levels by exercising and minimizing your intake of saturated fat. Eat a balanced diet with plenty of fruit and vegetables. Blood tests for lipids and cholesterol should begin at age 25 and be repeated every 5 years. If your lipid or cholesterol levels are high, you are over 50, or you are at high risk for heart disease, you may need your cholesterol levels checked more frequently. Ongoing high lipid and cholesterol  levels should be treated with medicines if diet and exercise are not working.  . If you smoke, find out from your health care provider how to quit. If you do not use tobacco, please do not start.  . If you choose to drink alcohol, please do not consume more than 2 drinks per day. One drink is considered to be 12 ounces (355 mL) of beer, 5 ounces (148 mL) of wine, or 1.5 ounces (44 mL) of liquor.  . If you are 88-103 years old, ask your health care provider if you should take aspirin to prevent strokes.  . Use sunscreen. Apply  sunscreen liberally and repeatedly throughout the day. You should seek shade when your shadow is shorter than you. Protect yourself by wearing long sleeves, pants, a wide-brimmed hat, and sunglasses year round, whenever you are outdoors.  . Once a month, do a whole body skin exam, using a mirror to look at the skin on your back. Tell your health care provider of new moles, moles that have irregular borders, moles that are larger than a pencil eraser, or moles that have changed in shape or color.

## 2017-08-12 NOTE — Progress Notes (Signed)
PCP notes:   Health maintenance:  No gaps identified.   Abnormal screenings:   Fall risk - hx of single fall Fall Risk  08/12/2017 08/08/2016 08/07/2015 07/25/2014 07/19/2013  Falls in the past year? Yes No No No Yes  Comment fell in kitchen causing chest pain; seen by ortho - - - -  Number falls in past yr: 1 - - - 1  Injury with Fall? Yes - - - Yes   Patient concerns:   Patient is experiencing GERD-like symptoms and wants to discuss these with PCP at next appt.  Nurse concerns:  None  Next PCP appt:   08/14/17 @ 1045

## 2017-08-12 NOTE — Progress Notes (Signed)
Subjective:   Valerie Ho is a 77 y.o. female who presents for Medicare Annual (Subsequent) preventive examination.  Review of Systems:  N/A Cardiac Risk Factors include: advanced age (>48men, >31 women);dyslipidemia     Objective:     Vitals: BP 108/78 (BP Location: Right Arm, Patient Position: Sitting, Cuff Size: Normal)   Pulse 76   Temp 98.4 F (36.9 C) (Oral)   Ht 5' 2.75" (1.594 m) Comment: no shoes  Wt 151 lb 12 oz (68.8 kg)   SpO2 98%   BMI 27.10 kg/m   Body mass index is 27.1 kg/m.  Advanced Directives 08/12/2017 08/08/2016  Does Patient Have a Medical Advance Directive? Yes No  Type of Paramedic of [place];Living will -  Copy of [place] in Chart? No - copy requested -    Tobacco Social History   Tobacco Use  Smoking Status Never Smoker  Smokeless Tobacco Never Used     Counseling given: No   Clinical Intake:  Pre-visit preparation completed: Yes  Pain : No/denies pain Pain Score: 0-No pain     Nutritional Status: BMI 25 -29 Overweight Nutritional Risks: None Diabetes: No  How often do you need to have someone help you when you read instructions, pamphlets, or other written materials from your doctor or pharmacy?: 1 - Never What is the last grade level you completed in school?: 12th grade  Interpreter Needed?: No  Comments: pt lives with spouse Information entered by :: LPinson, LPN  Past Medical History:  Diagnosis Date  . BCC (basal cell carcinoma of skin)   . Cataract   . Compression fracture of L2 ([place]) 2014   after a fall   . Hyperlipidemia   . Hypothyroid   . Lactose intolerance   . Osteopenia    Past Surgical History:  Procedure Laterality Date  . BACK SURGERY  1988   lumbar disc surgery   Family History  Problem Relation Age of Onset  . Heart disease Mother        chf  . Cancer Paternal Uncle   . Diabetes Maternal Grandfather   . Colon cancer Neg Hx   . Breast cancer  Neg Hx    Social History   Socioeconomic History  . Marital status: Married    Spouse name: Not on file  . Number of children: Not on file  . Years of education: Not on file  . Highest education level: Not on file  Occupational History  . Not on file  Social Needs  . Financial resource strain: Not on file  . Food insecurity:    Worry: Not on file    Inability: Not on file  . Transportation needs:    Medical: Not on file    Non-medical: Not on file  Tobacco Use  . Smoking status: Never Smoker  . Smokeless tobacco: Never Used  Substance and Sexual Activity  . Alcohol use: No    Alcohol/week: 0.0 oz  . Drug use: No  . Sexual activity: Not on file  Lifestyle  . Physical activity:    Days per week: Not on file    Minutes per session: Not on file  . Stress: Not on file  Relationships  . Social connections:    Talks on phone: Not on file    Gets together: Not on file    Attends religious service: Not on file    Active member of club or organization: Not on file  Attends meetings of clubs or organizations: Not on file    Relationship status: Not on file  Other Topics Concern  . Not on file  Social History Narrative   From [place]   Retired from [employer], [place]   Enjoys knitting, reading   Married 1961    Outpatient Encounter Medications as of 08/12/2017  Medication Sig  . Calcium Carbonate-Vitamin D (CALCIUM-VITAMIN D) 600-200 MG-UNIT CAPS Take 1 tablet by mouth 2 (two) times daily.    . Cholecalciferol (VITAMIN D) 1000 UNITS capsule Take 1,000 Units by mouth daily.    . Coenzyme Q10 (COQ-10) 200 MG CAPS Take 1 capsule by mouth daily.  . Ibuprofen (ADVIL) 200 MG CAPS Take 1-2 capsules by mouth at bedtime as needed.  Marland Kitchen levothyroxine (SYNTHROID, LEVOTHROID) 75 MCG tablet Take 1 tablet (75 mcg total) by mouth daily.  . meclizine (ANTIVERT) 25 MG tablet Take 0.5-1 tablets (12.5-25 mg total) by mouth 3 (three) times daily as needed for dizziness.  .  Omega-3 Fatty Acids (FISH OIL) 1000 MG CAPS Take 1,000 capsules by mouth daily.   No facility-administered encounter medications on file as of 08/12/2017.     Activities of Daily Living In your present state of health, do you have any difficulty performing the following activities: 08/12/2017  Hearing? Y  Vision? N  Difficulty concentrating or making decisions? N  Walking or climbing stairs? N  Dressing or bathing? N  Doing errands, shopping? N  Preparing Food and eating ? N  Using the Toilet? N  In the past six months, have you accidently leaked urine? Y  Do you have problems with loss of bowel control? N  Managing your Medications? N  Managing your Finances? N  Housekeeping or managing your Housekeeping? N  Some recent data might be hidden    Patient Care Team: Tonia Ghent, MD as PCP - General (Family Medicine) Katy Apo, MD as Consulting Physician (Ophthalmology)    Assessment:   This is a routine wellness examination for Valerie Ho.  Hearing Screening Comments: Bilateral hearing aids Vision Screening Comments: Last vision exam in Oct 2018 @ [facility]  Exercise Activities and Dietary recommendations Current Exercise Habits: Home exercise routine, Type of exercise: treadmill;Other - see comments(stationary bike, elliptical ), Time (Minutes): 60, Frequency (Times/Week): 3, Weekly Exercise (Minutes/Week): 180, Intensity: Moderate, Exercise limited by: None identified  Goals    . Increase physical activity     Starting 08/12/2017, I will continue to exercise for 60 minutes 3 days per week.        Fall Risk Fall Risk  08/12/2017 08/08/2016 08/07/2015 07/25/2014 07/19/2013  Falls in the past year? Yes No No No Yes  Comment fell in kitchen causing chest pain; seen by ortho - - - -  Number falls in past yr: 1 - - - 1  Injury with Fall? Yes - - - Yes   Depression Screen PHQ 2/9 Scores 08/12/2017 08/08/2016 08/07/2015 07/25/2014  PHQ - 2 Score 0 0 0 0    PHQ- 9 Score 0 - - -     Cognitive Function MMSE - Mini Mental State Exam 08/12/2017 08/08/2016  Orientation to time 5 5  Orientation to Place 5 5  Registration 3 3  Attention/ Calculation 0 0  Recall 3 3  Language- name 2 objects 0 0  Language- repeat 1 1  Language- follow 3 step command 3 3  Language- read & follow direction 0 0  Write a sentence 0 0  Copy design 0 0  Total score 20 20     PLEASE NOTE: A Mini-Cog screen was completed. Maximum score is 20. A value of 0 denotes this part of [place]stein MMSE was not completed or the patient failed this part of the Mini-Cog screening.   Mini-Cog Screening Orientation to Time - Max 5 pts Orientation to Place - Max 5 pts Registration - Max 3 pts Recall - Max 3 pts Language Repeat - Max 1 pts Language Follow 3 Step Command - Max 3 pts     Immunization History  Administered Date(s) Administered  . Influenza Split 01/20/2012  . Influenza, High Dose Seasonal PF 02/05/2017  . Influenza,inj,Quad PF,6+ Mos 03/08/2013, 03/14/2016  . Pneumococcal Conjugate-13 07/25/2014  . Pneumococcal Polysaccharide-23 01/20/2012  . Td 12/30/2007    Screening Tests Health Maintenance  Topic Date Due  . MAMMOGRAM  09/09/2017  . INFLUENZA VACCINE  11/05/2017  . TETANUS/TDAP  12/29/2017  . DEXA SCAN  Completed  . PNA vac Low Risk Adult  Completed       Plan:      I have personally reviewed, addressed, and noted the following in the patient's chart:  A. Medical and social history B. Use of alcohol, tobacco or illicit drugs  C. Current medications and supplements D. Functional ability and status E.  Nutritional status F.  Physical activity G. Advance directives H. List of other physicians I.  Hospitalizations, surgeries, and ER visits in previous 12 months J.  [place] to include hearing, vision, cognitive, depression L. Referrals and appointments - none  In addition, I have reviewed and discussed with patient certain  preventive protocols, quality metrics, and best practice recommendations. A written personalized care plan for preventive services as well as general preventive health recommendations were provided to patient.  See attached scanned questionnaire for additional information.   Signed,   Lindell Noe, MHA, BS, LPN Health Coach

## 2017-08-14 ENCOUNTER — Encounter: Payer: Self-pay | Admitting: Family Medicine

## 2017-08-14 ENCOUNTER — Ambulatory Visit (INDEPENDENT_AMBULATORY_CARE_PROVIDER_SITE_OTHER): Payer: Medicare HMO | Admitting: Family Medicine

## 2017-08-14 VITALS — BP 114/66 | HR 68 | Temp 97.8°F | Ht 62.75 in | Wt 153.2 lb

## 2017-08-14 DIAGNOSIS — K219 Gastro-esophageal reflux disease without esophagitis: Secondary | ICD-10-CM | POA: Diagnosis not present

## 2017-08-14 DIAGNOSIS — Z7189 Other specified counseling: Secondary | ICD-10-CM

## 2017-08-14 DIAGNOSIS — Z8781 Personal history of (healed) traumatic fracture: Secondary | ICD-10-CM

## 2017-08-14 DIAGNOSIS — M8580 Other specified disorders of bone density and structure, unspecified site: Secondary | ICD-10-CM

## 2017-08-14 DIAGNOSIS — E039 Hypothyroidism, unspecified: Secondary | ICD-10-CM | POA: Diagnosis not present

## 2017-08-14 DIAGNOSIS — Z0000 Encounter for general adult medical examination without abnormal findings: Secondary | ICD-10-CM

## 2017-08-14 MED ORDER — LEVOTHYROXINE SODIUM 75 MCG PO TABS: 75.0000 ug | ORAL_TABLET | Freq: Every day | ORAL | 3 refills | Status: DC

## 2017-08-14 NOTE — Patient Instructions (Addendum)
Check with your insurance to see if they will cover the TDAP (tetanus) shot. Check with your insurance to see if they will cover the shringrix shot. If you don't get a call about colonoscopy this fall then let us know.  We will call about your referral.  Valerie Ho or Valerie Ho will call you if you don't see one of them on the way out.  If needed, you can take ranitidine 75-150mg  a day for heartburn.   Take care.  Glad to see you.

## 2017-08-14 NOTE — Progress Notes (Signed)
I reviewed health advisor's note, was available for consultation, and agree with documentation and plan.  

## 2017-08-14 NOTE — Progress Notes (Signed)
Fall risk d/w pt.  Cautions d/w pt.  She saw ortho in the meantime.  Sx resolved in the meantime.    Hypothyroidism.  TSH wnl.  Labs d/w pt.  Compliant.  No neck mass.  No dysphagia.    GERD sx.  Started a few months ago.  Lower substernal sx with burping.  Belching helps.  No vomiting.  No diarrhea.  No nausea.  Rare nsaid use.  Taking rare tums with relief.  See avs.    H/o compression fracture.  No pain now.  Discussed with patient about follow-up bone density test.  Vitamin D is normal. Labs d/w pt.   Tetanus 2009, see avs.  Flu shot encouraged.   PNA vaccine prev done.  Shingles d/w pt.    Colonoscopy 2009. See AVS.  D/w pt.   Mammogram pending.  Advance directive- would have her husband then both of her kids designated if patient were incapacitated.  DXA ordered.  H/o osteopenia.   Diet and exercise d/w pt.  Exercise 3x/week.  Diet is good.    Lipids improved.  Discussed with patient.  PMH and SH reviewed  ROS: Per HPI unless specifically indicated in ROS section   Meds, vitals, and allergies reviewed.   GEN: nad, alert and oriented HEENT: mucous membranes moist NECK: supple w/o LA CV: rrr PULM: ctab, no inc wob ABD: soft, +bs EXT: no edema SKIN: no acute rash

## 2017-08-16 DIAGNOSIS — K219 Gastro-esophageal reflux disease without esophagitis: Secondary | ICD-10-CM | POA: Insufficient documentation

## 2017-08-16 NOTE — Assessment & Plan Note (Addendum)
TSH wnl.  Labs d/w pt.  Compliant.  No neck mass.  No dysphagia.  Continue as is.  She agrees.   >25 minutes spent in face to face time with patient, >50% spent in counselling or coordination of care, discussing hypothyroidism, labs, history of compression fracture, rationale for rechecking bone density test, GERD symptoms, etc.

## 2017-08-16 NOTE — Assessment & Plan Note (Signed)
Tetanus 2009, see avs.  Flu shot encouraged.   PNA vaccine prev done.  Shingles d/w pt.    Colonoscopy 2009.  Mammogram pending.  DXA ordered.  H/o osteopenia.   Advance directive- would have her husband then both of her kids designated if patient were incapacitated.  Diet and exercise d/w pt.  Exercise 3x/week.  Diet is good.

## 2017-08-16 NOTE — Assessment & Plan Note (Signed)
Recheck DXA, d/w pt.  See notes on imaging.

## 2017-08-16 NOTE — Assessment & Plan Note (Signed)
Advance directive- would have her husband then both of her kids designated if patient were incapacitated.  

## 2017-08-16 NOTE — Assessment & Plan Note (Signed)
No exertional symptoms, belching helps, Tums helps, could use ranitidine as needed.  See after visit summary.  Discussed with patient.  Update me as needed.  She agrees.

## 2017-09-10 ENCOUNTER — Ambulatory Visit: Payer: Medicare HMO

## 2017-10-01 ENCOUNTER — Ambulatory Visit
Admission: RE | Admit: 2017-10-01 | Discharge: 2017-10-01 | Disposition: A | Discharge status: Home / Self Care | Payer: Medicare HMO | Source: Ambulatory Visit | Attending: Family Medicine | Admitting: Family Medicine

## 2017-10-01 DIAGNOSIS — Z1231 Encounter for screening mammogram for malignant neoplasm of breast: Secondary | ICD-10-CM | POA: Diagnosis not present

## 2017-10-01 DIAGNOSIS — M85852 Other specified disorders of bone density and structure, left thigh: Secondary | ICD-10-CM | POA: Diagnosis not present

## 2017-10-01 DIAGNOSIS — M8580 Other specified disorders of bone density and structure, unspecified site: Secondary | ICD-10-CM

## 2017-10-01 DIAGNOSIS — Z780 Asymptomatic menopausal state: Secondary | ICD-10-CM | POA: Diagnosis not present

## 2017-11-24 ENCOUNTER — Encounter: Payer: Self-pay | Admitting: Family Medicine

## 2017-11-24 ENCOUNTER — Ambulatory Visit (INDEPENDENT_AMBULATORY_CARE_PROVIDER_SITE_OTHER): Payer: Medicare HMO | Admitting: Family Medicine

## 2017-11-24 VITALS — BP 116/60 | HR 84 | Temp 98.3°F | Ht 62.75 in | Wt 151.0 lb

## 2017-11-24 DIAGNOSIS — M8580 Other specified disorders of bone density and structure, unspecified site: Secondary | ICD-10-CM | POA: Diagnosis not present

## 2017-11-24 NOTE — Progress Notes (Signed)
D/w pt about DXA results and osteoporosis path/phys in general, including vit D and calcium.  Reasonable to consider treatment with bisphosphonate, ie fosamax based on FRAX scoring.  D/w pt about risk benefit, especially GI sx, jaw and long bone pathology.   She'll consider and update me as needed.   >25 minutes spent in face to face time with patient, >50% spent in counselling or coordination of care.    H/o L 2 compression fx after a fall, dw pt.   Vit d wnl, TSH wnl, nonsmoker.   Meds, vitals, and allergies reviewed.   ROS: Per HPI unless specifically indicated in ROS section   nad Remainder of exam deferred.

## 2017-11-24 NOTE — Patient Instructions (Signed)
It is reasonable to to consider fosamax.  Think about it.  Keep taking vitamin D and keep exercising.  Update me when you think about this.   Take care.  Glad to see you.

## 2017-11-25 NOTE — Assessment & Plan Note (Addendum)
D/w pt about DXA results and osteoporosis path/phys in general, including vit D and calcium.  Reasonable to consider treatment with bisphosphonate, ie fosamax based on FRAX scoring.  D/w pt about risk benefit, especially GI sx, jaw and long bone pathology.   She'll consider and update me as needed.   >25 minutes spent in face to face time with patient, >50% spent in counselling or coordination of care.    H/o L 2 compression fx after a fall, dw pt.   Vit d wnl, TSH wnl, nonsmoker.  Continue vitamin D.

## 2017-12-08 DIAGNOSIS — M545 Low back pain: Secondary | ICD-10-CM | POA: Diagnosis not present

## 2018-01-20 DIAGNOSIS — R69 Illness, unspecified: Secondary | ICD-10-CM | POA: Diagnosis not present

## 2018-02-01 ENCOUNTER — Encounter: Payer: Self-pay | Admitting: Gastroenterology

## 2018-02-03 DIAGNOSIS — R69 Illness, unspecified: Secondary | ICD-10-CM | POA: Diagnosis not present

## 2018-02-09 DIAGNOSIS — D229 Melanocytic nevi, unspecified: Secondary | ICD-10-CM | POA: Diagnosis not present

## 2018-02-09 DIAGNOSIS — L570 Actinic keratosis: Secondary | ICD-10-CM | POA: Diagnosis not present

## 2018-02-09 DIAGNOSIS — L819 Disorder of pigmentation, unspecified: Secondary | ICD-10-CM | POA: Diagnosis not present

## 2018-02-09 DIAGNOSIS — L814 Other melanin hyperpigmentation: Secondary | ICD-10-CM | POA: Diagnosis not present

## 2018-02-09 DIAGNOSIS — Z85828 Personal history of other malignant neoplasm of skin: Secondary | ICD-10-CM | POA: Diagnosis not present

## 2018-02-09 DIAGNOSIS — D1801 Hemangioma of skin and subcutaneous tissue: Secondary | ICD-10-CM | POA: Diagnosis not present

## 2018-02-09 DIAGNOSIS — L821 Other seborrheic keratosis: Secondary | ICD-10-CM | POA: Diagnosis not present

## 2018-03-18 DIAGNOSIS — H25013 Cortical age-related cataract, bilateral: Secondary | ICD-10-CM | POA: Diagnosis not present

## 2018-03-18 DIAGNOSIS — H2513 Age-related nuclear cataract, bilateral: Secondary | ICD-10-CM | POA: Diagnosis not present

## 2018-03-18 DIAGNOSIS — H5203 Hypermetropia, bilateral: Secondary | ICD-10-CM | POA: Diagnosis not present

## 2018-05-14 ENCOUNTER — Ambulatory Visit: Payer: Medicare HMO | Admitting: Family Medicine

## 2018-06-21 DIAGNOSIS — M25511 Pain in right shoulder: Secondary | ICD-10-CM | POA: Diagnosis not present

## 2018-06-21 DIAGNOSIS — M5441 Lumbago with sciatica, right side: Secondary | ICD-10-CM | POA: Diagnosis not present

## 2018-06-21 DIAGNOSIS — M4692 Unspecified inflammatory spondylopathy, cervical region: Secondary | ICD-10-CM | POA: Diagnosis not present

## 2018-06-21 DIAGNOSIS — M67911 Unspecified disorder of synovium and tendon, right shoulder: Secondary | ICD-10-CM | POA: Diagnosis not present

## 2018-08-19 ENCOUNTER — Ambulatory Visit (INDEPENDENT_AMBULATORY_CARE_PROVIDER_SITE_OTHER): Payer: Medicare HMO

## 2018-08-19 DIAGNOSIS — Z0000 Encounter for general adult medical examination without abnormal findings: Secondary | ICD-10-CM

## 2018-08-19 NOTE — Progress Notes (Signed)
No charge. Appt rescheduled due to technical issues.

## 2018-08-19 NOTE — Patient Instructions (Signed)
 Preventive Care for Adults  A healthy lifestyle and preventive care can promote health and wellness. Preventive health guidelines for adults include the following key practices.  . A routine yearly physical is a good way to check with your health care provider about your health and preventive screening. It is a chance to share any concerns and updates on your health and to receive a thorough exam.  . Visit your dentist for a routine exam and preventive care every 6 months. Brush your teeth twice a day and floss once a day. Good oral hygiene prevents tooth decay and gum disease.  . The frequency of eye exams is based on your age, health, family medical history, use  of contact lenses, and other factors. Follow your health care provider's recommendations for frequency of eye exams.  . Eat a healthy diet. Foods like vegetables, fruits, whole grains, low-fat dairy products, and lean protein foods contain the nutrients you need without too many calories. Decrease your intake of foods high in solid fats, added sugars, and salt. Eat the right amount of calories for you. Get information about a proper diet from your health care provider, if necessary.  . Regular physical exercise is one of the most important things you can do for your health. Most adults should get at least 150 minutes of moderate-intensity exercise (any activity that increases your heart rate and causes you to sweat) each week. In addition, most adults need muscle-strengthening exercises on 2 or more days a week.  Silver Sneakers may be a benefit available to you. To determine eligibility, you may visit the website: www.silversneakers.com or contact program at 1-866-584-7389 Mon-Fri between 8AM-8PM.   . Maintain a healthy weight. The body mass index (BMI) is a screening tool to identify possible weight problems. It provides an estimate of body fat based on height and weight. Your health care provider can find your BMI and can help you  achieve or maintain a healthy weight.   For adults 20 years and older: ? A BMI below 18.5 is considered underweight. ? A BMI of 18.5 to 24.9 is normal. ? A BMI of 25 to 29.9 is considered overweight. ? A BMI of 30 and above is considered obese.   . Maintain normal blood lipids and cholesterol levels by exercising and minimizing your intake of saturated fat. Eat a balanced diet with plenty of fruit and vegetables. Blood tests for lipids and cholesterol should begin at age 20 and be repeated every 5 years. If your lipid or cholesterol levels are high, you are over 50, or you are at high risk for heart disease, you may need your cholesterol levels checked more frequently. Ongoing high lipid and cholesterol levels should be treated with medicines if diet and exercise are not working.  . If you smoke, find out from your health care provider how to quit. If you do not use tobacco, please do not start.  . If you choose to drink alcohol, please do not consume more than 2 drinks per day. One drink is considered to be 12 ounces (355 mL) of beer, 5 ounces (148 mL) of wine, or 1.5 ounces (44 mL) of liquor.  . If you are 55-79 years old, ask your health care provider if you should take aspirin to prevent strokes.  . Use sunscreen. Apply sunscreen liberally and repeatedly throughout the day. You should seek shade when your shadow is shorter than you. Protect yourself by wearing long sleeves, pants, a wide-brimmed hat, and sunglasses   year round, whenever you are outdoors.  . Once a month, do a whole body skin exam, using a mirror to look at the skin on your back. Tell your health care provider of new moles, moles that have irregular borders, moles that are larger than a pencil eraser, or moles that have changed in shape or color.     

## 2018-08-20 ENCOUNTER — Other Ambulatory Visit (INDEPENDENT_AMBULATORY_CARE_PROVIDER_SITE_OTHER): Payer: Medicare HMO

## 2018-08-20 ENCOUNTER — Other Ambulatory Visit: Payer: Medicare HMO

## 2018-08-20 ENCOUNTER — Other Ambulatory Visit: Payer: Self-pay | Admitting: Family Medicine

## 2018-08-20 DIAGNOSIS — E039 Hypothyroidism, unspecified: Secondary | ICD-10-CM | POA: Diagnosis not present

## 2018-08-20 DIAGNOSIS — E7800 Pure hypercholesterolemia, unspecified: Secondary | ICD-10-CM | POA: Diagnosis not present

## 2018-08-20 DIAGNOSIS — M8580 Other specified disorders of bone density and structure, unspecified site: Secondary | ICD-10-CM

## 2018-08-20 LAB — COMPREHENSIVE METABOLIC PANEL
ALT: 12 U/L (ref 0–35)
AST: 17 U/L (ref 0–37)
Albumin: 3.7 g/dL (ref 3.5–5.2)
Alkaline Phosphatase: 64 U/L (ref 39–117)
BUN: 16 mg/dL (ref 6–23)
CO2: 26 mEq/L (ref 19–32)
Calcium: 8.5 mg/dL (ref 8.4–10.5)
Chloride: 105 mEq/L (ref 96–112)
Creatinine, Ser: 0.86 mg/dL (ref 0.40–1.20)
GFR: 63.83 mL/min (ref >60.00)
Glucose, Bld: 88 mg/dL (ref 70–99)
Potassium: 3.9 mEq/L (ref 3.5–5.1)
Sodium: 139 mEq/L (ref 135–145)
Total Bilirubin: 0.5 mg/dL (ref 0.2–1.2)
Total Protein: 6.9 g/dL (ref 6.0–8.3)

## 2018-08-20 LAB — VITAMIN D 25 HYDROXY (VIT D DEFICIENCY, FRACTURES): VITD: 37.69 ng/mL (ref 30.00–100.00)

## 2018-08-20 LAB — LIPID PANEL
Cholesterol: 186 mg/dL (ref 0–200)
HDL: 36.2 mg/dL — ABNORMAL LOW (ref >39.00)
LDL Cholesterol: 127 mg/dL — ABNORMAL HIGH (ref 0–99)
NonHDL: 149.81
Total CHOL/HDL Ratio: 5
Triglycerides: 116 mg/dL (ref 0.0–149.0)
VLDL: 23.2 mg/dL (ref 0.0–40.0)

## 2018-08-20 LAB — TSH: TSH: 2.16 u[IU]/mL (ref 0.35–4.50)

## 2018-08-23 ENCOUNTER — Ambulatory Visit (INDEPENDENT_AMBULATORY_CARE_PROVIDER_SITE_OTHER): Payer: Medicare HMO

## 2018-08-23 DIAGNOSIS — Z0000 Encounter for general adult medical examination without abnormal findings: Secondary | ICD-10-CM

## 2018-08-23 NOTE — Patient Instructions (Signed)
Valerie Ho , Thank you for taking time to come for your Medicare Wellness Visit. I appreciate your ongoing commitment to your health goals. Please review the following plan we discussed and let me know if I can assist you in the future.   These are the goals we discussed: Goals    . Patient Stated     Starting 08/23/2018, I will continue to take medication as prescribed.       This is a list of the screening recommended for you and due dates:  Health Maintenance  Topic Date Due  . Tetanus Vaccine  04/06/2020*  . Mammogram  10/02/2018  . Flu Shot  11/06/2018  . DEXA scan (bone density measurement)  Completed  . Pneumonia vaccines  Completed  *Topic was postponed. The date shown is not the original due date.   Preventive Care for Adults  A healthy lifestyle and preventive care can promote health and wellness. Preventive health guidelines for adults include the following key practices.  . A routine yearly physical is a good way to check with your health care provider about your health and preventive screening. It is a chance to share any concerns and updates on your health and to receive a thorough exam.  . Visit your dentist for a routine exam and preventive care every 6 months. Brush your teeth twice a day and floss once a day. Good oral hygiene prevents tooth decay and gum disease.  . The frequency of eye exams is based on your age, health, family medical history, use  of contact lenses, and other factors. Follow your health care provider's recommendations for frequency of eye exams.  . Eat a healthy diet. Foods like vegetables, fruits, whole grains, low-fat dairy products, and lean protein foods contain the nutrients you need without too many calories. Decrease your intake of foods high in solid fats, added sugars, and salt. Eat the right amount of calories for you. Get information about a proper diet from your health care provider, if necessary.  . Regular physical exercise is one  of the most important things you can do for your health. Most adults should get at least 150 minutes of moderate-intensity exercise (any activity that increases your heart rate and causes you to sweat) each week. In addition, most adults need muscle-strengthening exercises on 2 or more days a week.  Silver Sneakers may be a benefit available to you. To determine eligibility, you may visit the website: www.silversneakers.com or contact program at 873-666-5308 Mon-Fri between 8AM-8PM.   . Maintain a healthy weight. The body mass index (BMI) is a screening tool to identify possible weight problems. It provides an estimate of body fat based on height and weight. Your health care provider can find your BMI and can help you achieve or maintain a healthy weight.   For adults 20 years and older: ? A BMI below 18.5 is considered underweight. ? A BMI of 18.5 to 24.9 is normal. ? A BMI of 25 to 29.9 is considered overweight. ? A BMI of 30 and above is considered obese.   . Maintain normal blood lipids and cholesterol levels by exercising and minimizing your intake of saturated fat. Eat a balanced diet with plenty of fruit and vegetables. Blood tests for lipids and cholesterol should begin at age 75 and be repeated every 5 years. If your lipid or cholesterol levels are high, you are over 50, or you are at high risk for heart disease, you may need your cholesterol levels checked  more frequently. Ongoing high lipid and cholesterol levels should be treated with medicines if diet and exercise are not working.  . If you smoke, find out from your health care provider how to quit. If you do not use tobacco, please do not start.  . If you choose to drink alcohol, please do not consume more than 2 drinks per day. One drink is considered to be 12 ounces (355 mL) of beer, 5 ounces (148 mL) of wine, or 1.5 ounces (44 mL) of liquor.  . If you are 27-50 years old, ask your health care provider if you should take aspirin  to prevent strokes.  . Use sunscreen. Apply sunscreen liberally and repeatedly throughout the day. You should seek shade when your shadow is shorter than you. Protect yourself by wearing long sleeves, pants, a wide-brimmed hat, and sunglasses year round, whenever you are outdoors.  . Once a month, do a whole body skin exam, using a mirror to look at the skin on your back. Tell your health care provider of new moles, moles that have irregular borders, moles that are larger than a pencil eraser, or moles that have changed in shape or color.

## 2018-08-23 NOTE — Progress Notes (Signed)
PCP notes:   Health maintenance:  Tetanus vaccine - postponed/insurance  Abnormal screenings:   Fall risk - hx of single fall Fall Risk  08/23/2018 08/19/2018 08/12/2017 08/08/2016 08/07/2015  Falls in the past year? 1 0 Yes No No  Comment Fall 2019 tripped over rug - fell in kitchen causing chest pain; seen by ortho - -  Number falls in past yr: 0 - 1 - -  Injury with Fall? 1 - Yes - -    Patient concerns:   None  Nurse concerns:  None  Next PCP appt:   08/24/18 @ 0945  I reviewed health advisor's note, was available for consultation on the day of service listed in this note, and agree with documentation and plan. Elsie Stain, MD.

## 2018-08-23 NOTE — Progress Notes (Signed)
Subjective:   Valerie Ho is a 78 y.o. female who presents for Medicare Annual/Subsequent preventive examination.  Review of Systems:  N/A Cardiac Risk Factors include: advanced age (>33men, >20 women);dyslipidemia     Objective:    Vitals: There were no vitals taken for this visit.  There is no height or weight on file to calculate BMI.  Advanced Directives 08/23/2018 08/12/2017 08/08/2016  Does Patient Have a Medical Advance Directive? Yes Yes No  Type of Paramedic of [place];Living will [place];Living will -  Copy of [place] in Chart? Yes - validated most recent copy scanned in chart (See row information) No - copy requested -    Tobacco Social History   Tobacco Use  Smoking Status Never Smoker  Smokeless Tobacco Never Used     Counseling given: No   Clinical Intake:  Pre-visit preparation completed: Yes  Pain : No/denies pain Pain Score: 0-No pain     Nutritional Status: BMI 25 -29 Overweight Nutritional Risks: None Diabetes: No  How often do you need to have someone help you when you read instructions, pamphlets, or other written materials from your doctor or pharmacy?: 1 - Never What is the last grade level you completed in school?: 12th grade + some college  Interpreter Needed?: No  Comments: pt lives with spouse Information entered by :: LPinson, LPN  Past Medical History:  Diagnosis Date  . BCC (basal cell carcinoma of skin)   . Cataract   . Compression fracture of L2 ([place]) 2014   after a fall   . Hyperlipidemia   . Hypothyroid   . Lactose intolerance   . Osteopenia    Past Surgical History:  Procedure Laterality Date  . BACK SURGERY  1988   lumbar disc surgery   Family History  Problem Relation Age of Onset  . Heart disease Mother        chf  . Cancer Paternal Uncle   . Diabetes Maternal Grandfather   . Colon cancer Neg Hx   . Breast cancer Neg Hx    Social History    Socioeconomic History  . Marital status: Married    Spouse name: Not on file  . Number of children: Not on file  . Years of education: Not on file  . Highest education level: Not on file  Occupational History  . Not on file  Social Needs  . Financial resource strain: Not on file  . Food insecurity:    Worry: Not on file    Inability: Not on file  . Transportation needs:    Medical: Not on file    Non-medical: Not on file  Tobacco Use  . Smoking status: Never Smoker  . Smokeless tobacco: Never Used  Substance and Sexual Activity  . Alcohol use: No    Alcohol/week: 0.0 standard drinks  . Drug use: No  . Sexual activity: Not Currently  Lifestyle  . Physical activity:    Days per week: Not on file    Minutes per session: Not on file  . Stress: Not on file  Relationships  . Social connections:    Talks on phone: Not on file    Gets together: Not on file    Attends religious service: Not on file    Active member of club or organization: Not on file    Attends meetings of clubs or organizations: Not on file    Relationship status: Not on file  Other Topics Concern  . Not on file  Social History Narrative   From [place]   Retired from [employer], [place]   Enjoys knitting, reading, crosswords   Married 1961    Outpatient Encounter Medications as of 08/23/2018  Medication Sig  . calcium carbonate (TUMS) 500 MG chewable tablet Chew 1 tablet by mouth as needed for indigestion or heartburn.  . Calcium Carbonate-Vitamin D (CALCIUM-VITAMIN D) 600-200 MG-UNIT CAPS Take 1 tablet by mouth 2 (two) times daily.    . Cholecalciferol (VITAMIN D) 1000 UNITS capsule Take 1,000 Units by mouth daily.    . Coenzyme Q10 (COQ-10) 200 MG CAPS Take 1 capsule by mouth daily.  . Ibuprofen (ADVIL) 200 MG CAPS Take 1-2 capsules by mouth at bedtime as needed.  Marland Kitchen levothyroxine (SYNTHROID, LEVOTHROID) 75 MCG tablet Take 1 tablet (75 mcg total) by mouth daily.  . meclizine (ANTIVERT)  25 MG tablet Take 0.5-1 tablets (12.5-25 mg total) by mouth 3 (three) times daily as needed for dizziness.  . Omega-3 Fatty Acids (FISH OIL) 1000 MG CAPS Take 1,000 capsules by mouth daily.   No facility-administered encounter medications on file as of 08/23/2018.     Activities of Daily Living In your present state of health, do you have any difficulty performing the following activities: 08/23/2018 08/19/2018  Hearing? Y N  Vision? N N  Difficulty concentrating or making decisions? N N  Walking or climbing stairs? N N  Dressing or bathing? N N  Doing errands, shopping? N N  Preparing Food and eating ? N N  Using the Toilet? N N  In the past six months, have you accidently leaked urine? N N  Do you have problems with loss of bowel control? N N  Managing your Medications? N N  Managing your Finances? N N  Housekeeping or managing your Housekeeping? N N  Some recent data might be hidden    Patient Care Team: Tonia Ghent, MD as PCP - General (Family Medicine) Katy Apo, MD as Consulting Physician (Ophthalmology)   Assessment:   This is a routine wellness examination for Valerie Ho.  Vision Screening Comments: Oct 2019 Dr. Prudencio Burly   Exercise Activities and Dietary recommendations Current Exercise Habits: The patient does not participate in regular exercise at present, Exercise limited by: None identified  Goals    . Patient Stated     Starting 08/23/2018, I will continue to take medication as prescribed.       Fall Risk Fall Risk  08/23/2018 08/19/2018 08/12/2017 08/08/2016 08/07/2015  Falls in the past year? 0 0 Yes No No  Comment - - fell in kitchen causing chest pain; seen by ortho - -  Number falls in past yr: - - 1 - -  Injury with Fall? - - Yes - -   Depression Screen PHQ 2/9 Scores 08/23/2018 08/19/2018 08/12/2017 08/08/2016  PHQ - 2 Score 0 0 0 0  PHQ- 9 Score 0 0 0 -    Cognitive Function MMSE - Mini Mental State Exam 08/23/2018 08/19/2018 08/12/2017 08/08/2016  Orientation  to time 5 5 5 5   Orientation to Place 5 5 5 5   Registration 3 3 3 3   Attention/ Calculation 0 0 0 0  Recall 3 3 3 3   Language- name 2 objects 0 0 0 0  Language- repeat 1 1 1 1   Language- follow 3 step command 0 0 3 3  Language- read & follow direction 0 0 0 0  Write a sentence 0  0 0 0  Copy design 0 0 0 0  Total score 17 17 20 20      PLEASE NOTE: A Mini-Cog screen was completed. Maximum score is 17. A value of 0 denotes this part of [place] MMSE was not completed or the patient failed this part of the Mini-Cog screening.   Mini-Cog Screening Orientation to Time - Max 5 pts Orientation to Place - Max 5 pts Registration - Max 3 pts Recall - Max 3 pts Language Repeat - Max 1 pts      Immunization History  Administered Date(s) Administered  . Influenza Split 01/20/2012  . Influenza, High Dose Seasonal PF 02/06/2017  . Influenza,inj,Quad PF,6+ Mos 03/08/2013, 03/14/2016  . Pneumococcal Conjugate-13 07/25/2014  . Pneumococcal Polysaccharide-23 01/20/2012  . Td 12/30/2007    Screening Tests Health Maintenance  Topic Date Due  . TETANUS/TDAP  04/06/2020 (Originally 12/29/2017)  . MAMMOGRAM  10/02/2018  . INFLUENZA VACCINE  11/06/2018  . DEXA SCAN  Completed  . PNA vac Low Risk Adult  Completed       Plan:     I have personally reviewed, addressed, and noted the following in the patient's chart:  A. Medical and social history B. Use of alcohol, tobacco or illicit drugs  C. Current medications and supplements D. Functional ability and status E.  Nutritional status F.  Physical activity G. Advance directives H. List of other physicians I.  Hospitalizations, surgeries, and ER visits in previous 12 months J.  Vitals (unless it is a telemedicine encounter) K. Screenings to include cognitive, depression, hearing, vision (NOTE: hearing and vision screenings not completed in telemedicine encounter) L. Referrals and appointments   In addition, I have reviewed and discussed  with patient certain preventive protocols, quality metrics, and best practice recommendations. A written personalized care plan for preventive services and recommendations were provided to patient.  With patient's permission, we connected on 08/23/18 at  9:00 AM EDT by a video enabled telemedicine application. Two patient identifiers were used to ensure the encounter occurred with the correct person.    Patient was in home and writer was in office.   Signed,   Lindell Noe, MHA, BS, LPN Health Coach

## 2018-08-24 ENCOUNTER — Ambulatory Visit (INDEPENDENT_AMBULATORY_CARE_PROVIDER_SITE_OTHER): Payer: Medicare HMO | Admitting: Family Medicine

## 2018-08-24 ENCOUNTER — Encounter: Payer: Self-pay | Admitting: Family Medicine

## 2018-08-24 VITALS — Wt 152.0 lb

## 2018-08-24 DIAGNOSIS — E039 Hypothyroidism, unspecified: Secondary | ICD-10-CM

## 2018-08-24 DIAGNOSIS — Z0000 Encounter for general adult medical examination without abnormal findings: Secondary | ICD-10-CM

## 2018-08-24 DIAGNOSIS — E785 Hyperlipidemia, unspecified: Secondary | ICD-10-CM | POA: Diagnosis not present

## 2018-08-24 DIAGNOSIS — Z7189 Other specified counseling: Secondary | ICD-10-CM

## 2018-08-24 DIAGNOSIS — M8580 Other specified disorders of bone density and structure, unspecified site: Secondary | ICD-10-CM | POA: Diagnosis not present

## 2018-08-24 MED ORDER — LEVOTHYROXINE SODIUM 75 MCG PO TABS: 75.0000 ug | ORAL_TABLET | Freq: Every day | ORAL | 3 refills | Status: DC

## 2018-08-24 NOTE — Progress Notes (Signed)
Virtual visit completed through WebEx or similar program Patient location: home  Provider location: [place] at [facility], office   Limitations and rationale for visit method d/w patient.  Patient agreed to proceed.   CC: follow up.    HPI:  Pandemic cautions d/w pt.   Fall cautions d/w pt.   Tetanus 2009, d/w pt.  Flu shot encouraged.   PNA vaccine prev done.  Shingles d/w pt.    D/w patient [id number] for colon cancer screening, including IFOB vs. colonoscopy.  Risks and benefits of both were discussed and patient voiced understanding.  Pt elects to consider cologuard vs colonoscopy and then she'll update [place].   Mammogram 2019.  DXA 2019.  H/o osteopenia.  she declined treatment at this point.  D/w pt.   Advance directive- would have her husband then both of her kids designated if patient were incapacitated. Diet and exercise d/w pt.  Exercise 3x/week.  Diet is good.    Hypothyroidism.  TSH wnl.  No ADE on med.  No neck mass.  No dysphagia.   Elevated Cholesterol: No rx meds.  Diet compliance:yes Exercise:yes Labs d/w pt.   No recent vertigo or meclizine use.    Past medical history, social history, family history reviewed with patient.  Meds and allergies reviewed.   ROS: Per HPI unless specifically indicated in ROS section   NAD Speech wnl  A/P:  Tetanus 2009, d/w pt.  Flu shot encouraged.   PNA vaccine prev done.  Shingles d/w pt.    D/w patient [id number] for colon cancer screening, including IFOB vs. colonoscopy.  Risks and benefits of both were discussed and patient voiced understanding.  Pt elects to consider cologuard vs colonoscopy and then she'll update [place].   Mammogram 2019.  DXA 2019.  H/o osteopenia.  she declined treatment at this point.  D/w pt.   Advance directive- would have her husband then both of her kids designated if patient were incapacitated. Diet and exercise d/w pt.  Exercise 3x/week.  Diet is good.    Hypothyroidism.  TSH wnl.  No  ADE on med.  No neck mass.  No dysphagia.  Continue as is.  She agrees.  DXA 2019.  H/o osteopenia.  she declined treatment at this point.  D/w pt about previous imaging results and options for treatment.  We can revisit this in the future.  Elevated Cholesterol: No rx meds.  Reasonable to continue work on diet and exercise.  She agrees.  >25 minutes spent in face to face time with patient via video connection, >50% spent in counselling or coordination of care

## 2018-08-25 NOTE — Assessment & Plan Note (Signed)
Advance directive- would have her husband then both of her kids designated if patient were incapacitated.  

## 2018-08-25 NOTE — Assessment & Plan Note (Signed)
No rx meds.  Reasonable to continue work on diet and exercise.  She agrees.

## 2018-08-25 NOTE — Assessment & Plan Note (Signed)
TSH wnl.  No ADE on med.  No neck mass.  No dysphagia.  Continue as is.  She agrees.

## 2018-08-25 NOTE — Assessment & Plan Note (Signed)
Tetanus 2009, d/w pt.  Flu shot encouraged.   PNA vaccine prev done.  Shingles d/w pt.    D/w patient [id number] for colon cancer screening, including IFOB vs. colonoscopy.  Risks and benefits of both were discussed and patient voiced understanding.  Pt elects to consider cologuard vs colonoscopy and then she'll update [place].   Mammogram 2019.  DXA 2019.  H/o osteopenia.  she declined treatment at this point.  D/w pt.   Advance directive- would have her husband then both of her kids designated if patient were incapacitated. Diet and exercise d/w pt.  Exercise 3x/week.  Diet is good.

## 2018-08-25 NOTE — Assessment & Plan Note (Signed)
DXA 2019.  H/o osteopenia.  she declined treatment at this point.  D/w pt about previous imaging results and options for treatment.  We can revisit this in the future.

## 2018-11-25 ENCOUNTER — Other Ambulatory Visit: Payer: Self-pay | Admitting: Family Medicine

## 2018-11-25 DIAGNOSIS — Z1231 Encounter for screening mammogram for malignant neoplasm of breast: Secondary | ICD-10-CM

## 2019-01-07 ENCOUNTER — Ambulatory Visit: Payer: Medicare HMO

## 2019-01-17 ENCOUNTER — Ambulatory Visit
Admission: RE | Admit: 2019-01-17 | Discharge: 2019-01-17 | Disposition: A | Discharge status: Home / Self Care | Payer: Medicare HMO | Source: Ambulatory Visit | Attending: Family Medicine | Admitting: Family Medicine

## 2019-01-17 ENCOUNTER — Other Ambulatory Visit: Payer: Self-pay

## 2019-01-17 DIAGNOSIS — Z1231 Encounter for screening mammogram for malignant neoplasm of breast: Secondary | ICD-10-CM | POA: Diagnosis not present

## 2019-01-21 DIAGNOSIS — R69 Illness, unspecified: Secondary | ICD-10-CM | POA: Diagnosis not present

## 2019-01-25 DIAGNOSIS — R69 Illness, unspecified: Secondary | ICD-10-CM | POA: Diagnosis not present

## 2019-01-28 DIAGNOSIS — H43812 Vitreous degeneration, left eye: Secondary | ICD-10-CM | POA: Diagnosis not present

## 2019-03-01 DIAGNOSIS — R69 Illness, unspecified: Secondary | ICD-10-CM | POA: Diagnosis not present

## 2019-03-21 DIAGNOSIS — H5203 Hypermetropia, bilateral: Secondary | ICD-10-CM | POA: Diagnosis not present

## 2019-03-21 DIAGNOSIS — H2513 Age-related nuclear cataract, bilateral: Secondary | ICD-10-CM | POA: Diagnosis not present

## 2019-03-21 DIAGNOSIS — H43812 Vitreous degeneration, left eye: Secondary | ICD-10-CM | POA: Diagnosis not present

## 2019-03-21 DIAGNOSIS — H25013 Cortical age-related cataract, bilateral: Secondary | ICD-10-CM | POA: Diagnosis not present

## 2019-05-17 ENCOUNTER — Other Ambulatory Visit: Payer: Self-pay | Admitting: *Deleted

## 2019-09-05 ENCOUNTER — Telehealth: Payer: Self-pay | Admitting: Family Medicine

## 2019-09-06 NOTE — Telephone Encounter (Signed)
Please call and schedule MWV with nurse and CPE with Dr. Damita Dunnings.

## 2019-09-12 NOTE — Telephone Encounter (Signed)
Called patient and got her scheduled for AWV, CPE  and lab.

## 2019-10-26 ENCOUNTER — Other Ambulatory Visit: Payer: Self-pay | Admitting: Family Medicine

## 2019-10-26 DIAGNOSIS — E039 Hypothyroidism, unspecified: Secondary | ICD-10-CM

## 2019-10-26 DIAGNOSIS — E785 Hyperlipidemia, unspecified: Secondary | ICD-10-CM

## 2019-10-26 DIAGNOSIS — M8580 Other specified disorders of bone density and structure, unspecified site: Secondary | ICD-10-CM

## 2019-11-08 ENCOUNTER — Other Ambulatory Visit (INDEPENDENT_AMBULATORY_CARE_PROVIDER_SITE_OTHER): Payer: Medicare HMO

## 2019-11-08 ENCOUNTER — Other Ambulatory Visit: Payer: Self-pay

## 2019-11-08 ENCOUNTER — Ambulatory Visit (INDEPENDENT_AMBULATORY_CARE_PROVIDER_SITE_OTHER): Payer: Medicare HMO

## 2019-11-08 DIAGNOSIS — E039 Hypothyroidism, unspecified: Secondary | ICD-10-CM

## 2019-11-08 DIAGNOSIS — Z0000 Encounter for general adult medical examination without abnormal findings: Secondary | ICD-10-CM

## 2019-11-08 DIAGNOSIS — M8580 Other specified disorders of bone density and structure, unspecified site: Secondary | ICD-10-CM

## 2019-11-08 DIAGNOSIS — E785 Hyperlipidemia, unspecified: Secondary | ICD-10-CM | POA: Diagnosis not present

## 2019-11-08 LAB — COMPREHENSIVE METABOLIC PANEL
ALT: 12 U/L (ref 0–35)
AST: 17 U/L (ref 0–37)
Albumin: 3.8 g/dL (ref 3.5–5.2)
Alkaline Phosphatase: 76 U/L (ref 39–117)
BUN: 10 mg/dL (ref 6–23)
CO2: 30 mEq/L (ref 19–32)
Calcium: 8.9 mg/dL (ref 8.4–10.5)
Chloride: 105 mEq/L (ref 96–112)
Creatinine, Ser: 0.79 mg/dL (ref 0.40–1.20)
GFR: 70.18 mL/min (ref >60.00)
Glucose, Bld: 78 mg/dL (ref 70–99)
Potassium: 4.4 mEq/L (ref 3.5–5.1)
Sodium: 138 mEq/L (ref 135–145)
Total Bilirubin: 0.4 mg/dL (ref 0.2–1.2)
Total Protein: 6.8 g/dL (ref 6.0–8.3)

## 2019-11-08 LAB — VITAMIN D 25 HYDROXY (VIT D DEFICIENCY, FRACTURES): VITD: 34.14 ng/mL (ref 30.00–100.00)

## 2019-11-08 LAB — LIPID PANEL
Cholesterol: 188 mg/dL (ref 0–200)
HDL: 36 mg/dL — ABNORMAL LOW (ref >39.00)
LDL Cholesterol: 127 mg/dL — ABNORMAL HIGH (ref 0–99)
NonHDL: 151.68
Total CHOL/HDL Ratio: 5
Triglycerides: 124 mg/dL (ref 0.0–149.0)
VLDL: 24.8 mg/dL (ref 0.0–40.0)

## 2019-11-08 LAB — TSH: TSH: 1.58 u[IU]/mL (ref 0.35–4.50)

## 2019-11-08 NOTE — Progress Notes (Signed)
PCP notes:  Health Maintenance: Dexa- due Flu- due   Abnormal Screenings: none   Patient concerns: none   Nurse concerns: none   Next PCP appt.: 11/15/2019 @ 8:30 am

## 2019-11-08 NOTE — Progress Notes (Signed)
Subjective:   Valerie Ho is a 79 y.o. female who presents for Medicare Annual (Subsequent) preventive examination.  Review of Systems: N/A     I connected with the patient today by telephone and verified that I am speaking with the correct person using two identifiers. Location patient: home Location nurse: work Persons participating in the virtual visit: patient, Marine scientist.   I discussed the limitations, risks, security and privacy concerns of performing an evaluation and management service by telephone and the availability of in person appointments. I also discussed with the patient that there may be a patient responsible charge related to this service. The patient expressed understanding and verbally consented to this telephonic visit.    Interactive audio and video telecommunications were attempted between this nurse and patient, however failed, due to patient having technical difficulties OR patient did not have access to video capability.  We continued and completed visit with audio only.     Cardiac Risk Factors include: advanced age (>71men, >41 women);dyslipidemia     Objective:    Today's Vitals   There is no height or weight on file to calculate BMI.  Advanced Directives 11/08/2019 08/23/2018 08/12/2017 08/08/2016  Does Patient Have a Medical Advance Directive? Yes Yes Yes No  Type of Paramedic of [place];Living will [place];Living will [place];Living will -  Copy of [place] in Chart? Yes - validated most recent copy scanned in chart (See row information) Yes - validated most recent copy scanned in chart (See row information) No - copy requested -    Current Medications (verified) Outpatient Encounter Medications as of 11/08/2019  Medication Sig   calcium carbonate (TUMS) 500 MG chewable tablet Chew 1 tablet by mouth as needed for indigestion or heartburn.   Calcium Carbonate-Vitamin D  (CALCIUM-VITAMIN D) 600-200 MG-UNIT CAPS Take 1 tablet by mouth 2 (two) times daily.     Cholecalciferol (VITAMIN D) 1000 UNITS capsule Take 1,000 Units by mouth daily.     Coenzyme Q10 (COQ-10) 200 MG CAPS Take 1 capsule by mouth daily.   EUTHYROX 75 MCG tablet Take 1 tablet by mouth once daily   Ibuprofen (ADVIL) 200 MG CAPS Take 1-2 capsules by mouth at bedtime as needed.   meclizine (ANTIVERT) 25 MG tablet Take 0.5-1 tablets (12.5-25 mg total) by mouth 3 (three) times daily as needed for dizziness.   Omega-3 Fatty Acids (FISH OIL) 1000 MG CAPS Take 1,000 capsules by mouth daily.   No facility-administered encounter medications on file as of 11/08/2019.    Allergies (verified) Oxycodone and Propoxyphene n-acetaminophen   History: Past Medical History:  Diagnosis Date   BCC (basal cell carcinoma of skin)    Cataract    Compression fracture of L2 ([place]) 2014   after a fall    Hyperlipidemia    Hypothyroid    Lactose intolerance    Osteopenia    Past Surgical History:  Procedure Laterality Date   BACK SURGERY  1988   lumbar disc surgery   Family History  Problem Relation Age of Onset   Heart disease Mother        chf   Cancer Paternal Uncle    Diabetes Maternal Grandfather    Colon cancer Neg Hx    Breast cancer Neg Hx    Social History   Socioeconomic History   Marital status: Married    Spouse name: Not on file   Number of children: Not on  file   Years of education: Not on file   Highest education level: Not on file  Occupational History   Not on file  Tobacco Use   Smoking status: Never Smoker   Smokeless tobacco: Never Used  Vaping Use   Vaping Use: Never used  Substance and Sexual Activity   Alcohol use: No    Alcohol/week: 0.0 standard drinks   Drug use: No   Sexual activity: Not Currently  Other Topics Concern   Not on file  Social History Narrative   From [place]   Retired from [employer], [employer]    Enjoys knitting, reading, crosswords   Married 1961   Social Determinants of Radio broadcast assistant Strain: Low Risk    Difficulty of Paying Living Expenses: Not hard at all  Food Insecurity: No Food Insecurity   Worried About Charity fundraiser in the Last Year: Never true   Arboriculturist in the Last Year: Never true  Transportation Needs: No Transportation Needs   Lack of Transportation (Medical): No   Lack of Transportation (Non-Medical): No  Physical Activity: Inactive   Days of Exercise per Week: 0 days   Minutes of Exercise per Session: 0 min  Stress: No Stress Concern Present   Feeling of Stress : Not at all  Social Connections:    Frequency of Communication with Friends and Family:    Frequency of Social Gatherings with Friends and Family:    Attends Religious Services:    Active Member of Clubs or Organizations:    Attends Music therapist:    Marital Status:     Tobacco Counseling Counseling given: Not Answered   Clinical Intake:  Pre-visit preparation completed: Yes  Pain : No/denies pain     Nutritional Risks: None Diabetes: No  How often do you need to have someone help you when you read instructions, pamphlets, or other written materials from your doctor or pharmacy?: 1 - Never What is the last grade level you completed in school?: 12th  Diabetic: No Nutrition Risk Assessment:  Has the patient had any N/V/D within the last 2 months?  No  Does the patient have any non-healing wounds?  No  Has the patient had any unintentional weight loss or weight gain?  No   Diabetes:  Is the patient diabetic?  No  If diabetic, was a CBG obtained today?  No  Did the patient bring in their glucometer from home?  No  How often do you monitor your CBG's? N/A.   Financial Strains and Diabetes Management:  Are you having any financial strains with the device, your supplies or your medication? N/A.  Does the patient want to be  seen by Chronic Care Management for management of their diabetes?  N/A Would the patient like to be referred to a Nutritionist or for Diabetic Management?  N/A     Interpreter Needed?: No  Information entered by :: CJohnson, LPN   Activities of Daily Living In your present state of health, do you have any difficulty performing the following activities: 11/08/2019  Hearing? Y  Comment wears hearing aids  Vision? N  Difficulty concentrating or making decisions? N  Walking or climbing stairs? N  Dressing or bathing? N  Doing errands, shopping? N  Preparing Food and eating ? N  Using the Toilet? N  In the past six months, have you accidently leaked urine? N  Do you have problems with loss of bowel control? N  Managing your Medications? N  Managing your Finances? N  Housekeeping or managing your Housekeeping? N  Some recent data might be hidden    Patient Care Team: Tonia Ghent, MD as PCP - General (Family Medicine) Katy Apo, MD as Consulting Physician (Ophthalmology)  Indicate any recent Medical Services you may have received from other than [facility] providers in the past year (date may be approximate).     Assessment:   This is a routine wellness examination for [place].  Hearing/Vision screen  Hearing Screening   125Hz  250Hz  500Hz  1000Hz  2000Hz  3000Hz  4000Hz  6000Hz  8000Hz   Right ear:           Left ear:           Vision Screening Comments: Patient gets annual eye exams  Dietary issues and exercise activities discussed: Current Exercise Habits: The patient does not participate in regular exercise at present, Exercise limited by: None identified  Goals     Patient Stated     Starting 08/23/2018, I will continue to take medication as prescribed.     Patient Stated     11/08/2019, I will maintain and continue medications as prescribed.       Depression Screen PHQ 2/9 Scores 11/08/2019 08/23/2018 08/19/2018 08/12/2017 08/08/2016 08/07/2015 07/25/2014  PHQ - 2 Score 0 0 0  0 0 0 0  PHQ- 9 Score 0 0 0 0 - - -    Fall Risk Fall Risk  11/08/2019 08/23/2018 08/19/2018 08/12/2017 08/08/2016  Falls in the past year? 0 1 0 Yes No  Comment - Fall 2019 tripped over rug - fell in kitchen causing chest pain; seen by ortho -  Number falls in past yr: 0 0 - 1 -  Injury with Fall? 0 1 - Yes -  Risk for fall due to : No Fall Risks - - - -  Follow up Falls evaluation completed;Falls prevention discussed - - - -    Any stairs in or around the home? Yes  If so, are there any without handrails? No  Home free of loose throw rugs in walkways, pet beds, electrical cords, etc? Yes  Adequate lighting in your home to reduce risk of falls? Yes   ASSISTIVE DEVICES UTILIZED TO PREVENT FALLS:  Life alert? No  Use of a cane, walker or w/c? No  Grab bars in the bathroom? No  Shower chair or bench in shower? No  Elevated toilet seat or a handicapped toilet? No   TIMED UP AND GO:  Was the test performed? N/A, telephonic visit .    Cognitive Function: MMSE - Mini Mental State Exam 11/08/2019 08/23/2018 08/19/2018 08/12/2017 08/08/2016  Orientation to time 5 5 5 5 5   Orientation to Place 5 5 5 5 5   Registration 3 3 3 3 3   Attention/ Calculation 5 0 0 0 0  Recall 3 3 3 3 3   Language- name 2 objects - 0 0 0 0  Language- repeat 1 1 1 1 1   Language- follow 3 step command - 0 0 3 3  Language- read & follow direction - 0 0 0 0  Write a sentence - 0 0 0 0  Copy design - 0 0 0 0  Total score - 17 17 20 20   Mini Cog  Mini-Cog screen was completed. Maximum score is 22. A value of 0 denotes this part of the MMSE was not completed or the patient failed this part of the Mini-Cog screening.      Immunizations  Immunization History  Administered Date(s) Administered   Influenza Split 01/20/2012   Influenza, High Dose Seasonal PF 02/06/2017   Influenza,inj,Quad PF,6+ Mos 03/08/2013, 03/14/2016   PFIZER SARS-COV-2 Vaccination 05/02/2019, 05/23/2019   Pneumococcal Conjugate-13 07/25/2014    Pneumococcal Polysaccharide-23 01/20/2012   Td 12/30/2007    TDAP status: Due, Education has been provided regarding the importance of this vaccine. Advised may receive this vaccine at local pharmacy or Health Dept. Aware to provide a copy of the vaccination record if obtained from local pharmacy or Health Dept. Verbalized acceptance and understanding. Flu Vaccine status: due, will be available in the office at the end of August  Pneumococcal vaccine status: Up to date Covid-19 vaccine status: Completed vaccines  Qualifies for Shingles Vaccine? Yes   Zostavax completed No   Shingrix Completed?: No.    Education has been provided regarding the importance of this vaccine. Patient has been advised to call insurance company to determine out of pocket expense if they have not yet received this vaccine. Advised may also receive vaccine at local pharmacy or Health Dept. Verbalized acceptance and understanding.  Screening Tests Health Maintenance  Topic Date Due   Hepatitis C Screening  Never done   INFLUENZA VACCINE  11/06/2019   TETANUS/TDAP  04/06/2020 (Originally 12/29/2017)   MAMMOGRAM  01/17/2020   DEXA SCAN  Completed   COVID-19 Vaccine  Completed   PNA vac Low Risk Adult  Completed    Health Maintenance  Health Maintenance Due  Topic Date Due   Hepatitis C Screening  Never done   INFLUENZA VACCINE  11/06/2019    Colorectal cancer screening: No longer required.  Mammogram status: Completed 01/17/2019. Repeat every year {Bone Density status: due, will have this  completed when mammogram is due  Lung Cancer Screening: (Low Dose CT Chest recommended if Age 19-80 years, 30 pack-year currently smoking OR have quit w/in 15years.) does not qualify.    Additional Screening:  Hepatitis C Screening: does qualify; Completed due  Vision Screening: Recommended annual ophthalmology exams for early detection of glaucoma and other disorders of the eye. Is the patient up to date  with their annual eye exam?  Yes  Who is the provider or what is the name of the office in which the patient attends annual eye exams? [facility], Dr. Prudencio Burly  If pt is not established with a provider, would they like to be referred to a provider to establish care? No .   Dental Screening: Recommended annual dental exams for proper oral hygiene  Community Resource Referral / Chronic Care Management: CRR required this visit?  No   CCM required this visit?  No      Plan:     I have personally reviewed and noted the following in the patients chart:    Medical and social history  Use of alcohol, tobacco or illicit drugs   Current medications and supplements  Functional ability and status  Nutritional status  Physical activity  Advanced directives  List of other physicians  Hospitalizations, surgeries, and ER visits in previous 12 months  Vitals  Screenings to include cognitive, depression, and falls  Referrals and appointments  In addition, I have reviewed and discussed with patient certain preventive protocols, quality metrics, and best practice recommendations. A written personalized care plan for preventive services as well as general preventive health recommendations were provided to patient.   Due to this being a telephonic visit, the after visit summary with patients personalized plan was offered to patient via  mail or my-chart. Patient preferred to pick up at office at next visit.   Andrez Grime, LPN   09/08/1495

## 2019-11-08 NOTE — Patient Instructions (Signed)
Valerie Ho , Thank you for taking time to come for your Medicare Wellness Visit. I appreciate your ongoing commitment to your health goals. Please review the following plan we discussed and let me know if I can assist you in the future.   Screening recommendations/referrals: Colonoscopy: no longer required Mammogram: Up to date, completed 01/17/2019, due 01/2020 Bone Density: due, will complete when mammogram is due Recommended yearly ophthalmology/optometry visit for glaucoma screening and checkup Recommended yearly dental visit for hygiene and checkup  Vaccinations: Influenza vaccine: due, will be available in the office at the end of August Pneumococcal vaccine: Completed series Tdap vaccine: decline, insurance/financial Shingles vaccine: due   Covid-19:Completed series  Advanced directives: copy in chart  Conditions/risks identified: hyperlipidemia  Next appointment: Follow up in one year for your annual wellness visit    Preventive Care 65 Years and Older, Female Preventive care refers to lifestyle choices and visits with your health care provider that can promote health and wellness. What does preventive care include?  A yearly physical exam. This is also called an annual well check.  Dental exams once or twice a year.  Routine eye exams. Ask your health care provider how often you should have your eyes checked.  Personal lifestyle choices, including:  Daily care of your teeth and gums.  Regular physical activity.  Eating a healthy diet.  Avoiding tobacco and drug use.  Limiting alcohol use.  Practicing safe sex.  Taking low-dose aspirin every day.  Taking vitamin and mineral supplements as recommended by your health care provider. What happens during an annual well check? The services and screenings done by your health care provider during your annual well check will depend on your age, overall health, lifestyle risk factors, and family history of  disease. Counseling  Your health care provider may ask you questions about your:  Alcohol use.  Tobacco use.  Drug use.  Emotional well-being.  Home and relationship well-being.  Sexual activity.  Eating habits.  History of falls.  Memory and ability to understand (cognition).  Work and work Statistician.  Reproductive health. Screening  You may have the following tests or measurements:  Height, weight, and BMI.  Blood pressure.  Lipid and cholesterol levels. These may be checked every 5 years, or more frequently if you are over 37 years old.  Skin check.  Lung cancer screening. You may have this screening every year starting at age 40 if you have a 30-pack-year history of smoking and currently smoke or have quit within the past 15 years.  Fecal occult blood test (FOBT) of the stool. You may have this test every year starting at age 64.  Flexible sigmoidoscopy or colonoscopy. You may have a sigmoidoscopy every 5 years or a colonoscopy every 10 years starting at age 59.  Hepatitis C blood test.  Hepatitis B blood test.  Sexually transmitted disease (STD) testing.  Diabetes screening. This is done by checking your blood sugar (glucose) after you have not eaten for a while (fasting). You may have this done every 1-3 years.  Bone density scan. This is done to screen for osteoporosis. You may have this done starting at age 31.  Mammogram. This may be done every 1-2 years. Talk to your health care provider about how often you should have regular mammograms. Talk with your health care provider about your test results, treatment options, and if necessary, the need for more tests. Vaccines  Your health care provider may recommend certain vaccines, such as:  Influenza  vaccine. This is recommended every year.  Tetanus, diphtheria, and acellular pertussis (Tdap, Td) vaccine. You may need a Td booster every 10 years.  Zoster vaccine. You may need this after age  23.  Pneumococcal 13-valent conjugate (PCV13) vaccine. One dose is recommended after age 1.  Pneumococcal polysaccharide (PPSV23) vaccine. One dose is recommended after age 74. Talk to your health care provider about which screenings and vaccines you need and how often you need them. This information is not intended to replace advice given to you by your health care provider. Make sure you discuss any questions you have with your health care provider. Document Released: 04/20/2015 Document Revised: 12/12/2015 Document Reviewed: 01/23/2015 Elsevier Interactive Patient Education  2017 [place] Prevention in the Home Falls can cause injuries. They can happen to people of all ages. There are many things you can do to make your home safe and to help prevent falls. What can I do on the outside of my home?  Regularly fix the edges of walkways and driveways and fix any cracks.  Remove anything that might make you trip as you walk through a door, such as a raised step or threshold.  Trim any bushes or trees on the path to your home.  Use bright outdoor lighting.  Clear any walking paths of anything that might make someone trip, such as rocks or tools.  Regularly check to see if handrails are loose or broken. Make sure that both sides of any steps have handrails.  Any raised decks and porches should have guardrails on the edges.  Have any leaves, snow, or ice cleared regularly.  Use sand or salt on walking paths during winter.  Clean up any spills in your garage right away. This includes oil or grease spills. What can I do in the bathroom?  Use night lights.  Install grab bars by the toilet and in the tub and shower. Do not use towel bars as grab bars.  Use non-skid mats or decals in the tub or shower.  If you need to sit down in the shower, use a plastic, non-slip stool.  Keep the floor dry. Clean up any water that spills on the floor as soon as it happens.  Remove  soap buildup in the tub or shower regularly.  Attach bath mats securely with double-sided non-slip rug tape.  Do not have throw rugs and other things on the floor that can make you trip. What can I do in the bedroom?  Use night lights.  Make sure that you have a light by your bed that is easy to reach.  Do not use any sheets or blankets that are too big for your bed. They should not hang down onto the floor.  Have a firm chair that has side arms. You can use this for support while you get dressed.  Do not have throw rugs and other things on the floor that can make you trip. What can I do in the kitchen?  Clean up any spills right away.  Avoid walking on wet floors.  Keep items that you use a lot in easy-to-reach places.  If you need to reach something above you, use a strong step stool that has a grab bar.  Keep electrical cords out of the way.  Do not use floor polish or wax that makes floors slippery. If you must use wax, use non-skid floor wax.  Do not have throw rugs and other things on the floor that can  make you trip. What can I do with my stairs?  Do not leave any items on the stairs.  Make sure that there are handrails on both sides of the stairs and use them. Fix handrails that are broken or loose. Make sure that handrails are as long as the stairways.  Check any carpeting to make sure that it is firmly attached to the stairs. Fix any carpet that is loose or worn.  Avoid having throw rugs at the top or bottom of the stairs. If you do have throw rugs, attach them to the floor with carpet tape.  Make sure that you have a light switch at the top of the stairs and the bottom of the stairs. If you do not have them, ask someone to add them for you. What else can I do to help prevent falls?  Wear shoes that:  Do not have high heels.  Have rubber bottoms.  Are comfortable and fit you well.  Are closed at the toe. Do not wear sandals.  If you use a  stepladder:  Make sure that it is fully opened. Do not climb a closed stepladder.  Make sure that both sides of the stepladder are locked into place.  Ask someone to hold it for you, if possible.  Clearly mark and make sure that you can see:  Any grab bars or handrails.  First and last steps.  Where the edge of each step is.  Use tools that help you move around (mobility aids) if they are needed. These include:  Canes.  Walkers.  Scooters.  Crutches.  Turn on the lights when you go into a dark area. Replace any light bulbs as soon as they burn out.  Set up your furniture so you have a clear path. Avoid moving your furniture around.  If any of your floors are uneven, fix them.  If there are any pets around you, be aware of where they are.  Review your medicines with your doctor. Some medicines can make you feel dizzy. This can increase your chance of falling. Ask your doctor what other things that you can do to help prevent falls. This information is not intended to replace advice given to you by your health care provider. Make sure you discuss any questions you have with your health care provider. Document Released: 01/18/2009 Document Revised: 08/30/2015 Document Reviewed: 04/28/2014 Elsevier Interactive Patient Education  2017 [employer].

## 2019-11-15 ENCOUNTER — Ambulatory Visit (INDEPENDENT_AMBULATORY_CARE_PROVIDER_SITE_OTHER): Payer: Medicare HMO | Admitting: Family Medicine

## 2019-11-15 ENCOUNTER — Encounter: Payer: Self-pay | Admitting: Family Medicine

## 2019-11-15 ENCOUNTER — Other Ambulatory Visit: Payer: Self-pay

## 2019-11-15 VITALS — BP 122/60 | HR 84 | Temp 96.7°F | Ht 63.0 in | Wt 151.2 lb

## 2019-11-15 DIAGNOSIS — E039 Hypothyroidism, unspecified: Secondary | ICD-10-CM | POA: Diagnosis not present

## 2019-11-15 DIAGNOSIS — Z1211 Encounter for screening for malignant neoplasm of colon: Secondary | ICD-10-CM

## 2019-11-15 DIAGNOSIS — Z7189 Other specified counseling: Secondary | ICD-10-CM

## 2019-11-15 DIAGNOSIS — M8580 Other specified disorders of bone density and structure, unspecified site: Secondary | ICD-10-CM | POA: Diagnosis not present

## 2019-11-15 DIAGNOSIS — Z0000 Encounter for general adult medical examination without abnormal findings: Secondary | ICD-10-CM

## 2019-11-15 MED ORDER — LEVOTHYROXINE SODIUM 75 MCG PO TABS: 75.0000 ug | ORAL_TABLET | Freq: Every day | ORAL | 3 refills | Status: DC

## 2019-11-15 NOTE — Progress Notes (Signed)
This visit occurred during the SARS-CoV-2 public health emergency.  Safety protocols were in place, including screening questions prior to the visit, additional usage of staff PPE, and extensive cleaning of exam room while observing appropriate contact time as indicated for disinfecting solutions.  Tetanus 2009, d/w pt.  Flu shot encouraged.  PNA vaccine prev done.  Shingles d/w pt.  covid 2021 Colonoscopy 2009.  D/w pt.  She wanted to get cologuard done.   Mammogram 2020 DXA 2019. H/o osteopenia. she declined treatment at this point. Discussed.   Advance directive- would have her husband then both of her kids designated if patient were incapacitated. Diet and exercise d/w pt.  Active. Diet is good.   Hypothyroidism.  Compliant.  No ADE on med.  16mcg a day.  No neck mass.  No dysphagia.    She had her 2 year anniversary recently.  D/w pt.    She is taking 2200 IU vit D daily if adherent to vitamins.  She had missed some doses of vit D, level is still normal but lower than prev, d/w pt about options.    PMH and SH reviewed  ROS: Per HPI unless specifically indicated in ROS section   Meds, vitals, and allergies reviewed.   GEN: nad, alert and oriented HEENT: ncat NECK: supple w/o LA CV: rrr.   PULM: ctab, no inc wob ABD: soft, +bs EXT: no edema SKIN: no acute rash

## 2019-11-15 NOTE — Patient Instructions (Addendum)
Check with your insurance to see if they will cover the shingrix and tetanus shot. I would get a flu shot each fall.   Thanks for your effort.  Update me as needed.  Take care.  Glad to see you.

## 2019-11-16 NOTE — Assessment & Plan Note (Signed)
She is taking 2200 IU vit D daily if adherent to vitamins.  She had missed some doses of vit D, level is still normal but lower than prev, d/w pt about options.   DXA 2019. H/o osteopenia. she declined treatment at this point. Discussed.

## 2019-11-16 NOTE — Assessment & Plan Note (Signed)
°  Tetanus 2009, d/w pt.  Flu shot encouraged.  PNA vaccine prev done.  Shingles d/w pt.  covid 2021 Colonoscopy 2009.  D/w pt.  She wanted to get cologuard done.   Mammogram 2020 DXA 2019. H/o osteopenia. she declined treatment at this point. Discussed.   Advance directive- would have her husband then both of her kids designated if patient were incapacitated. Diet and exercise d/w pt.  Active. Diet is good.

## 2019-11-16 NOTE — Assessment & Plan Note (Signed)
Compliant.  No ADE on med.  61mcg a day.  No neck mass.  No dysphagia.

## 2019-11-16 NOTE — Assessment & Plan Note (Signed)
Advance directive- would have her husband then both of her kids designated if patient were incapacitated.  

## 2019-11-28 DIAGNOSIS — Z1211 Encounter for screening for malignant neoplasm of colon: Secondary | ICD-10-CM | POA: Diagnosis not present

## 2019-12-03 LAB — COLOGUARD
Cologuard: NEGATIVE
Cologuard: NEGATIVE

## 2019-12-26 ENCOUNTER — Encounter: Payer: Self-pay | Admitting: Family Medicine

## 2020-01-24 DIAGNOSIS — R69 Illness, unspecified: Secondary | ICD-10-CM | POA: Diagnosis not present

## 2020-02-08 ENCOUNTER — Other Ambulatory Visit: Payer: Self-pay | Admitting: Family Medicine

## 2020-02-08 DIAGNOSIS — Z1231 Encounter for screening mammogram for malignant neoplasm of breast: Secondary | ICD-10-CM

## 2020-02-24 DIAGNOSIS — R69 Illness, unspecified: Secondary | ICD-10-CM | POA: Diagnosis not present

## 2020-03-16 ENCOUNTER — Ambulatory Visit: Payer: Medicare HMO

## 2020-03-16 ENCOUNTER — Ambulatory Visit
Admission: RE | Admit: 2020-03-16 | Discharge: 2020-03-16 | Disposition: A | Discharge status: Home / Self Care | Payer: Medicare HMO | Source: Ambulatory Visit | Attending: Family Medicine | Admitting: Family Medicine

## 2020-03-16 ENCOUNTER — Other Ambulatory Visit: Payer: Self-pay

## 2020-03-16 DIAGNOSIS — Z1231 Encounter for screening mammogram for malignant neoplasm of breast: Secondary | ICD-10-CM

## 2020-03-26 DIAGNOSIS — H5203 Hypermetropia, bilateral: Secondary | ICD-10-CM | POA: Diagnosis not present

## 2020-03-26 DIAGNOSIS — H25013 Cortical age-related cataract, bilateral: Secondary | ICD-10-CM | POA: Diagnosis not present

## 2020-03-26 DIAGNOSIS — H2513 Age-related nuclear cataract, bilateral: Secondary | ICD-10-CM | POA: Diagnosis not present

## 2020-11-13 DIAGNOSIS — L905 Scar conditions and fibrosis of skin: Secondary | ICD-10-CM | POA: Diagnosis not present

## 2020-11-13 DIAGNOSIS — L821 Other seborrheic keratosis: Secondary | ICD-10-CM | POA: Diagnosis not present

## 2020-11-13 DIAGNOSIS — L280 Lichen simplex chronicus: Secondary | ICD-10-CM | POA: Diagnosis not present

## 2020-11-13 DIAGNOSIS — Z85828 Personal history of other malignant neoplasm of skin: Secondary | ICD-10-CM | POA: Diagnosis not present

## 2020-11-13 DIAGNOSIS — D225 Melanocytic nevi of trunk: Secondary | ICD-10-CM | POA: Diagnosis not present

## 2020-11-13 DIAGNOSIS — L814 Other melanin hyperpigmentation: Secondary | ICD-10-CM | POA: Diagnosis not present

## 2020-11-29 ENCOUNTER — Encounter: Payer: Self-pay | Admitting: Family Medicine

## 2020-11-29 ENCOUNTER — Other Ambulatory Visit: Payer: Self-pay

## 2020-11-29 ENCOUNTER — Ambulatory Visit (INDEPENDENT_AMBULATORY_CARE_PROVIDER_SITE_OTHER): Payer: Medicare HMO | Admitting: Family Medicine

## 2020-11-29 VITALS — BP 130/74 | HR 69 | Temp 97.4°F | Ht 63.0 in | Wt 149.8 lb

## 2020-11-29 DIAGNOSIS — M25551 Pain in right hip: Secondary | ICD-10-CM | POA: Diagnosis not present

## 2020-11-29 NOTE — Progress Notes (Signed)
Valerie Kalis T. [name], MD, [place] at [facility] [place], [address]  Phone: [phone]  FAX: [place] - 80 y.o. female  MRN [id number]  Date of Birth: November 09, 1940  Date: 11/29/2020  PCP: Tonia Ghent, MD  Referral: Tonia Ghent, MD  Chief Complaint  Patient presents with   Groin Pain    Right-Start last night-Couldn't left-Started again this morning but doing better now    This visit occurred during the SARS-CoV-2 public health emergency.  Safety protocols were in place, including screening questions prior to the visit, additional usage of staff PPE, and extensive cleaning of exam room while observing appropriate contact time as indicated for disinfecting solutions.   Subjective:   Valerie Ho is a 80 y.o. very pleasant female patient with Body mass index is 26.53 kg/m. who presents with the following:  R groin pain:  This AM could not lift her leg, and then right now she feels ok.  Sometimes in the groin it will hit, and yest she was having a hard time walking.   This AM she had a really hard time, but at this time she really is feeling much better.  No back pain.   Mild hip flexor strain on the R Very small boil in the region, too No hernia  Review of Systems is noted in the HPI, as appropriate  Objective:   BP 130/74   Pulse 69   Temp (!) 97.4 F (36.3 C) (Temporal)   Ht '5\' 3"'$  (1.6 m)   Wt 149 lb 12 oz (67.9 kg)   SpO2 96%   BMI 26.53 kg/m   GEN: No acute distress; alert,appropriate. PULM: Breathing comfortably in no respiratory distress PSYCH: Normally interactive.    HIP EXAM: SIDE: Right ROM: Abduction, Flexion, Internal and External range of motion: Full Pain with terminal IROM and EROM: None GTB: NT SLR: NEG Knees: No effusion FABER: NT REVERSE FABER: NT, neg Piriformis: NT at direct palpation Str: flexion: 5/5 abduction: 5/5 adduction:  5/5 Strength testing non-tender   She also has a tiny boil/pimple in the caudal groin  Laboratory and Imaging Data:  Assessment and Plan:     ICD-10-CM   1. Acute right hip pain  M25.551      The most important thing is that she is doing better now.  She may have had a minor hip flexor strain, or she may have had some symptoms for the very tiny boil/pimple.  Nevertheless, I do not think it matters that she is doing better now.   Dragon [employer] One speech-to-text software was used for transcription in this dictation.  Possible transcriptional errors can occur using Editor, commissioning.   Signed,  Maud Deed. Tychelle Purkey, MD   Outpatient Encounter Medications as of 11/29/2020  Medication Sig   calcium carbonate (TUMS - DOSED IN MG ELEMENTAL CALCIUM) 500 MG chewable tablet Chew 1 tablet by mouth as needed for indigestion or heartburn.   Calcium Carbonate-Vitamin D (CALCIUM-VITAMIN D) 600-200 MG-UNIT CAPS Take 1 tablet by mouth 2 (two) times daily.     Cholecalciferol (VITAMIN D) 1000 UNITS capsule Take 1,000 Units by mouth daily.     Coenzyme Q10 (COQ-10) 200 MG CAPS Take 1 capsule by mouth daily.   Ibuprofen 200 MG CAPS Take 1-2 capsules by mouth at bedtime as needed.   levothyroxine (EUTHYROX) 75 MCG tablet Take 1 tablet (75 mcg total) by mouth daily.  meclizine (ANTIVERT) 25 MG tablet Take 0.5-1 tablets (12.5-25 mg total) by mouth 3 (three) times daily as needed for dizziness.   Omega-3 Fatty Acids (FISH OIL) 1000 MG CAPS Take 1,000 capsules by mouth daily.   No facility-administered encounter medications on file as of 11/29/2020.

## 2020-12-20 ENCOUNTER — Telehealth: Payer: Self-pay | Admitting: Family Medicine

## 2020-12-21 NOTE — Telephone Encounter (Signed)
ERROR

## 2020-12-24 DIAGNOSIS — M25551 Pain in right hip: Secondary | ICD-10-CM | POA: Diagnosis not present

## 2020-12-31 ENCOUNTER — Encounter: Payer: Self-pay | Admitting: Family Medicine

## 2020-12-31 ENCOUNTER — Ambulatory Visit (INDEPENDENT_AMBULATORY_CARE_PROVIDER_SITE_OTHER): Payer: Medicare HMO | Admitting: Family Medicine

## 2020-12-31 ENCOUNTER — Other Ambulatory Visit: Payer: Self-pay

## 2020-12-31 VITALS — BP 110/64 | HR 71 | Temp 97.8°F | Ht 63.0 in | Wt 148.0 lb

## 2020-12-31 DIAGNOSIS — Z0000 Encounter for general adult medical examination without abnormal findings: Secondary | ICD-10-CM

## 2020-12-31 DIAGNOSIS — E785 Hyperlipidemia, unspecified: Secondary | ICD-10-CM

## 2020-12-31 DIAGNOSIS — N393 Stress incontinence (female) (male): Secondary | ICD-10-CM

## 2020-12-31 DIAGNOSIS — M25559 Pain in unspecified hip: Secondary | ICD-10-CM

## 2020-12-31 DIAGNOSIS — E039 Hypothyroidism, unspecified: Secondary | ICD-10-CM | POA: Diagnosis not present

## 2020-12-31 DIAGNOSIS — Z7189 Other specified counseling: Secondary | ICD-10-CM | POA: Diagnosis not present

## 2020-12-31 DIAGNOSIS — M8580 Other specified disorders of bone density and structure, unspecified site: Secondary | ICD-10-CM

## 2020-12-31 LAB — COMPREHENSIVE METABOLIC PANEL
ALT: 12 U/L (ref 0–35)
AST: 19 U/L (ref 0–37)
Albumin: 4.1 g/dL (ref 3.5–5.2)
Alkaline Phosphatase: 83 U/L (ref 39–117)
BUN: 10 mg/dL (ref 6–23)
CO2: 28 mEq/L (ref 19–32)
Calcium: 9.1 mg/dL (ref 8.4–10.5)
Chloride: 103 mEq/L (ref 96–112)
Creatinine, Ser: 0.87 mg/dL (ref 0.40–1.20)
GFR: 62.98 mL/min (ref >60.00)
Glucose, Bld: 89 mg/dL (ref 70–99)
Potassium: 4.2 mEq/L (ref 3.5–5.1)
Sodium: 139 mEq/L (ref 135–145)
Total Bilirubin: 0.4 mg/dL (ref 0.2–1.2)
Total Protein: 7.1 g/dL (ref 6.0–8.3)

## 2020-12-31 LAB — LIPID PANEL
Cholesterol: 220 mg/dL — ABNORMAL HIGH (ref 0–200)
HDL: 40.8 mg/dL (ref >39.00)
LDL Cholesterol: 147 mg/dL — ABNORMAL HIGH (ref 0–99)
NonHDL: 179.59
Total CHOL/HDL Ratio: 5
Triglycerides: 162 mg/dL — ABNORMAL HIGH (ref 0.0–149.0)
VLDL: 32.4 mg/dL (ref 0.0–40.0)

## 2020-12-31 LAB — VITAMIN D 25 HYDROXY (VIT D DEFICIENCY, FRACTURES): VITD: 37.51 ng/mL (ref 30.00–100.00)

## 2020-12-31 LAB — TSH: TSH: 7.89 u[IU]/mL — ABNORMAL HIGH (ref 0.35–5.50)

## 2020-12-31 MED ORDER — LEVOTHYROXINE SODIUM 75 MCG PO TABS: 75.0000 ug | ORAL_TABLET | Freq: Every day | ORAL | 3 refills | Status: DC

## 2020-12-31 NOTE — Patient Instructions (Signed)
We'll call about seeing urogynecology.  Go to the lab on the way out.   If you have mychart we'll likely use that to update you.    Take care.  Glad to see you.

## 2020-12-31 NOTE — Progress Notes (Signed)
This visit occurred during the SARS-CoV-2 public health emergency.  Safety protocols were in place, including screening questions prior to the visit, additional usage of staff PPE, and extensive cleaning of exam room while observing appropriate contact time as indicated for disinfecting solutions.  Hypothyroidism.  Due for labs.  Compliant except for having run out of levothyroxine recently.  No neck mass, no dysphagia.    Her hip is better in the meantime. D/w pt.  She saw ortho and was told she had trochanteric bursitis.  Episode of pain after lifting a 50lbs bag of goat feed.    She is putting up with stress incontinence.  Kegel's didn't help.  D/w pt about referral.  See plan.  Mammogram 2021, she'll f/u this winter.  DXA declined.  She wouldn't want to go through treatment.   Tetanus and shingles may be cheaper at the pharmacy.  PNA and covid prev done.  Flu to be done this fall.  She'll get at pharmacy.  Advance directive- would have her husband then both of her kids designated if patient were incapacitated.   Meds, vitals, and allergies reviewed.   ROS: Per HPI unless specifically indicated in ROS section   GEN: nad, alert and oriented HEENT: ncat NECK: supple w/o LA, no TMG noted on exam CV: rrr.  PULM: ctab, no inc wob ABD: soft, +bs EXT: no edema SKIN: Well-perfused.  30 minutes were devoted to patient care in this encounter (this includes time spent reviewing the patient's file/history, interviewing and examining the patient, counseling/reviewing plan with patient).

## 2021-01-02 ENCOUNTER — Other Ambulatory Visit: Payer: Self-pay | Admitting: Family Medicine

## 2021-01-02 DIAGNOSIS — E039 Hypothyroidism, unspecified: Secondary | ICD-10-CM

## 2021-01-02 DIAGNOSIS — N393 Stress incontinence (female) (male): Secondary | ICD-10-CM | POA: Insufficient documentation

## 2021-01-02 DIAGNOSIS — M25559 Pain in unspecified hip: Secondary | ICD-10-CM | POA: Insufficient documentation

## 2021-01-02 NOTE — Assessment & Plan Note (Signed)
Not having pain at time of exam.  Normal hip range of motion with flexion internal and external rotation.  Greater trochanter area not tender to palpation.  Discussed with patient.  If she has another flare I want her to see if she has pain with internal rotation.  I also want her to see if she has pain at the greater trochanteric area and she can update me as needed.

## 2021-01-02 NOTE — Assessment & Plan Note (Signed)
She declined repeat DEXA because she did not want to go through treatment but it would be reasonable to check vitamin D today.  See notes on labs.

## 2021-01-02 NOTE — Assessment & Plan Note (Signed)
Mammogram 2021, she'll f/u this winter.  DXA declined.  She wouldn't want to go through treatment.   Tetanus and shingles may be cheaper at the pharmacy.  PNA and covid prev done.  Flu to be done this fall.  She'll get at pharmacy.  Advance directive- would have her husband then both of her kids designated if patient were incapacitated.

## 2021-01-02 NOTE — Assessment & Plan Note (Signed)
Discussed options.  Refer to urogynecology for input.

## 2021-01-02 NOTE — Assessment & Plan Note (Signed)
  Advance directive- would have her husband then both of her kids designated if patient were incapacitated.  

## 2021-01-02 NOTE — Assessment & Plan Note (Signed)
No thyromegaly on exam.  See notes on labs.  Okay for outpatient follow-up.  Continue levothyroxine.

## 2021-02-25 NOTE — Progress Notes (Signed)
[facility] Urogynecology New Patient Evaluation and Consultation  Referring Provider: Tonia Ghent, MD PCP: Tonia Ghent, MD Date of Service: 02/26/2021  SUBJECTIVE Chief Complaint: New Patient (Initial Visit) (Valerie Ho is a 80 y.o. female here for a consult on incontinence./)  History of Present Illness: Valerie Ho is a 80 y.o. White or Caucasian female seen in consultation at the request of Dr. Damita Dunnings for evaluation of incontinence.    Review of records from Dr Damita Dunnings significant for: Having stress incontinence. Has tried kegels but it did not help.   Urinary Symptoms: Leaks urine with going from sitting to standing, with a full bladder, with urgency, and continuously Leaks "many" times per day Pad use: 1 pad per day.   She is bothered by her UI symptoms.  Day time voids- every 2-3 hours.  Nocturia: 2-3 times per night to void. Voiding dysfunction: she does not empty her bladder well.  does not use a catheter to empty bladder.  When urinating, she feels dribbling after finishing Drinks: 2 cups coffee, water- but does not drink much  UTIs:  0  UTI's in the last year.   Denies history of blood in urine and kidney or bladder stones  Pelvic Organ Prolapse Symptoms:                  She Denies a feeling of a bulge the vaginal area.   Bowel Symptom: Bowel movements: daily Stool consistency: soft  Straining: no.  Splinting: no.  Incomplete evacuation: yes, somewhat She Admits to accidental bowel leakage / fecal incontinence  Occurs: a few times per week, depends on what is eating.   Consistency with leakage: soft  or liquid Bowel regimen: none Last colonoscopy: Date- 2021 negative Cologard  Sexual Function Sexually active: no.   Pelvic Pain Denies pelvic pain   Past Medical History:  Past Medical History:  Diagnosis Date   BCC (basal cell carcinoma of skin)    Cataract    Compression fracture of L2 ([place]) 2014   after a fall    Hyperlipidemia     Hypothyroid    Lactose intolerance    Osteopenia      Past Surgical History:   Past Surgical History:  Procedure Laterality Date   BACK SURGERY  1988   lumbar disc surgery     Past OB/GYN History: OB History  Gravida Para Term Preterm AB Living  2 2 2     2   SAB IAB Ectopic Multiple Live Births          2    # Outcome Date GA Lbr Len/2nd Weight Sex Delivery Anes PTL Lv  2 Term        Y   1 Term             Menopausal: Yes, Denies vaginal bleeding since menopause  Any history of abnormal pap smears: no.   Medications: She has a current medication list which includes the following prescription(s): calcium-vitamin d, vitamin d, coq-10, ibuprofen, levothyroxine, mirabegron er, fish oil, and meclizine.   Allergies: Patient is allergic to oxycodone and propoxyphene n-acetaminophen.   Social History:  Social History   Tobacco Use   Smoking status: Never   Smokeless tobacco: Never  Vaping Use   Vaping Use: Never used  Substance Use Topics   Alcohol use: No    Alcohol/week: 0.0 standard drinks   Drug use: No    Relationship status: married She lives with spouse of .  She is not employed. Regular exercise: Yes: "very active" History of abuse: No  Family History:   Family History  Problem Relation Age of Onset   Heart disease Mother        chf   Cancer Paternal Uncle    Diabetes Maternal Grandfather    Colon cancer Neg Hx    Breast cancer Neg Hx      Review of Systems: Review of Systems  Constitutional:  Negative for fever, malaise/fatigue and weight loss.  Respiratory:  Negative for cough, shortness of breath and wheezing.   Cardiovascular:  Negative for chest pain, palpitations and leg swelling.  Gastrointestinal:  Negative for abdominal pain and blood in stool.  Genitourinary:  Negative for dysuria.  Musculoskeletal:  Negative for myalgias.  Skin:  Negative for rash.  Neurological:  Negative for dizziness and headaches.  Endo/Heme/Allergies:   Does not bruise/bleed easily.  Psychiatric/Behavioral:  Negative for depression. The patient is not nervous/anxious.     OBJECTIVE Physical Exam: Vitals:   02/26/21 1058  BP: 137/80  Pulse: 75  Weight: 148 lb (67.1 kg)  Height: 5\' 2"  (1.575 m)    Physical Exam Constitutional:      General: She is not in acute distress. Pulmonary:     Effort: Pulmonary effort is normal.  Abdominal:     General: There is no distension.     Palpations: Abdomen is soft.     Tenderness: There is no abdominal tenderness. There is no rebound.  Musculoskeletal:        General: No swelling. Normal range of motion.  Skin:    General: Skin is warm and dry.     Findings: No rash.  Neurological:     Mental Status: She is alert and oriented to person, place, and time.  Psychiatric:        Mood and Affect: Mood normal.        Behavior: Behavior normal.     GU / Detailed Urogynecologic Evaluation:  Pelvic Exam: Normal external female genitalia; Bartholin's and Skene's glands normal in appearance; urethral meatus normal in appearance, no urethral masses or discharge.   CST: negative  Speculum exam reveals normal vaginal mucosa with atrophy. Cervix normal appearance. Uterus normal single, nontender. Adnexa no mass, fullness, tenderness.     Pelvic floor strength II/V, puborectalis I/V external anal sphincter II/V  Pelvic floor musculature: Right levator non-tender, Right obturator non-tender, Left levator non-tender, Left obturator non-tender  POP-Q:   POP-Q  -3                                            Aa   -3                                           Ba  -7                                              C   2  Gh  3                                            Pb  8.5                                            tvl   -3                                            Ap  -3                                            Bp  -8.5                                               D     Rectal Exam:  Normal sphincter tone, no distal rectocele, enterocoele not present, no rectal masses, no sign of dyssynergia when asking the patient to bear down.  Post-Void Residual (PVR) by Bladder Scan: In order to evaluate bladder emptying, we discussed obtaining a postvoid residual and she agreed to this procedure.  Procedure: The ultrasound unit was placed on the patient's abdomen in the suprapubic region after the patient had voided. A PVR of 17 ml was obtained by bladder scan.  Laboratory Results: POC urine: small blood   ASSESSMENT AND PLAN Ms. Shearon is a 80 y.o. with:  1. Overactive bladder   2. Urinary frequency   3. Incontinence of feces, unspecified fecal incontinence type   4. Other microscopic hematuria    OAB - We discussed the symptoms of overactive bladder (OAB), which include urinary urgency, urinary frequency, nocturia, with or without urge incontinence.  While we do not know the exact etiology of OAB, several treatment options exist. We discussed management including behavioral therapy (decreasing bladder irritants, urge suppression strategies, timed voids, bladder retraining), physical therapy, medication. - Will start with medication. Prescribed Myrbetriq 25mg  daily. For Beta-3 agonist medication, we discussed the potential side effect of elevated blood pressure which is more likely to occur in individuals with uncontrolled hypertension. - Advised to drink more water- goal of 60 oz per day  2. Fecal incontinence - Treatment options include anti-diarrhea medication (loperamide/ Imodium OTC or prescription lomotil), fiber supplements, physical therapy, and possible sacral neuromodulation or surgery.   - She will add in fiber supplement- metamucil  3. Blood in urine - small blood in urine- will send for micro UA and culture to confirm presence of microscopic hematuria  Return 6 weeks for follow up  Jaquita Folds, MD

## 2021-02-26 ENCOUNTER — Other Ambulatory Visit (HOSPITAL_COMMUNITY)
Admission: RE | Admit: 2021-02-26 | Discharge: 2021-02-26 | Disposition: A | Discharge status: Home / Self Care | Payer: Medicare HMO | Source: Ambulatory Visit | Attending: Obstetrics and Gynecology | Admitting: Obstetrics and Gynecology

## 2021-02-26 ENCOUNTER — Other Ambulatory Visit: Payer: Self-pay

## 2021-02-26 ENCOUNTER — Ambulatory Visit (INDEPENDENT_AMBULATORY_CARE_PROVIDER_SITE_OTHER): Payer: Medicare HMO | Admitting: Obstetrics and Gynecology

## 2021-02-26 ENCOUNTER — Encounter: Payer: Self-pay | Admitting: Obstetrics and Gynecology

## 2021-02-26 VITALS — BP 137/80 | HR 75 | Ht 62.0 in | Wt 148.0 lb

## 2021-02-26 DIAGNOSIS — N3281 Overactive bladder: Secondary | ICD-10-CM | POA: Diagnosis not present

## 2021-02-26 DIAGNOSIS — R159 Full incontinence of feces: Secondary | ICD-10-CM | POA: Diagnosis not present

## 2021-02-26 DIAGNOSIS — R3129 Other microscopic hematuria: Secondary | ICD-10-CM | POA: Insufficient documentation

## 2021-02-26 DIAGNOSIS — R350 Frequency of micturition: Secondary | ICD-10-CM | POA: Diagnosis not present

## 2021-02-26 LAB — POCT URINALYSIS DIPSTICK
Appearance: NORMAL
Bilirubin, UA: NEGATIVE
Glucose, UA: NEGATIVE
Ketones, UA: NEGATIVE
Leukocytes, UA: NEGATIVE
Nitrite, UA: NEGATIVE
Protein, UA: NEGATIVE
Spec Grav, UA: 1.015 (ref 1.010–1.025)
Urobilinogen, UA: 0.2 E.U./dL
pH, UA: 7 (ref 5.0–8.0)

## 2021-02-26 LAB — URINALYSIS, ROUTINE W REFLEX MICROSCOPIC
Bacteria, UA: NONE SEEN
Bilirubin Urine: NEGATIVE
Glucose, UA: NEGATIVE mg/dL
Ketones, ur: NEGATIVE mg/dL
Leukocytes,Ua: NEGATIVE
Nitrite: NEGATIVE
Protein, ur: NEGATIVE mg/dL
Specific Gravity, Urine: 1.005 (ref 1.005–1.030)
pH: 7 (ref 5.0–8.0)

## 2021-02-26 MED ORDER — MIRABEGRON ER 50 MG PO TB24: 50.0000 mg | ORAL_TABLET | Freq: Every day | ORAL | 5 refills | Status: DC

## 2021-02-26 NOTE — Patient Instructions (Addendum)
Today we talked about ways to manage bladder urgency such as altering your diet to avoid irritative beverages and foods (bladder diet) as well as attempting to decrease stress and other exacerbating factors.  Goal is to drink 60 oz water per day.   The Most Bothersome Foods* The Least Bothersome Foods*  Coffee - Regular & Decaf Tea - caffeinated Carbonated beverages - cola, non-colas, diet & caffeine-free Alcohols - Beer, Red Wine, White Wine, Champagne Fruits - Grapefruit, [place], Orange, [employer] - Cranberry, Grapefruit, Orange, Pineapple Vegetables - Tomato & Tomato Products Flavor Enhancers - Hot peppers, Spicy foods, Chili, Horseradish, Vinegar, Monosodium glutamate (MSG) Artificial Sweeteners - NutraSweet, Sweet 'N Low, Equal (sweetener), Saccharin Ethnic foods - [place], [place], [place] food Express Scripts - low-fat & whole Fruits - Bananas, Blueberries, Honeydew melon, Pears, Raisins, Watermelon Vegetables - Broccoli, Brussels Sprouts, [place], Carrots, Cauliflower, [place], Cucumber, Mushrooms, Peas, Radishes, Squash, Zucchini, White potatoes, Sweet potatoes & yams Poultry - Chicken, Eggs, [place], [employer] - Beef, Programmer, multimedia, Lamb Seafood - Shrimp, [place] fish, Salmon Grains - Oat, Rice Snacks - Pretzels, Popcorn  *Lissa Morales et al. Diet and its role in interstitial cystitis/bladder pain syndrome (IC/BPS) and comorbid conditions. [place] 2012 Jan 11.    Accidental Bowel Leakage: Our goal is to achieve formed bowel movements daily or every-other-day without leakage.  You may need to try different combinations of the following options to find what works best for you.  Some management options include: Dietary changes (more leafy greens, vegetables and fruits; less processed foods) Fiber supplementation (Metamucil or something with psyllium as active ingredient) Over-the-counter imodium (tablets or liquid) to help solidify the stool and prevent leakage of stool.

## 2021-02-27 ENCOUNTER — Telehealth: Payer: Self-pay | Admitting: Obstetrics and Gynecology

## 2021-02-27 ENCOUNTER — Other Ambulatory Visit: Payer: Self-pay | Admitting: Family Medicine

## 2021-02-27 DIAGNOSIS — Z1231 Encounter for screening mammogram for malignant neoplasm of breast: Secondary | ICD-10-CM

## 2021-02-27 NOTE — Telephone Encounter (Signed)
Called patient due to questions with Myrbetriq. I explained this is not one of the medications that is linked with dementia. However, it was $300/ month with her insurance. She would like to try pelvic floor exercises at home first. Will mail handout.   Jaquita Folds, MD

## 2021-02-28 LAB — URINE CULTURE: Culture: <10000 — AB

## 2021-04-05 ENCOUNTER — Ambulatory Visit: Payer: Medicare HMO

## 2021-04-09 ENCOUNTER — Ambulatory Visit: Payer: Medicare HMO | Admitting: Obstetrics and Gynecology

## 2021-04-09 ENCOUNTER — Encounter: Payer: Self-pay | Admitting: Obstetrics and Gynecology

## 2021-04-09 ENCOUNTER — Other Ambulatory Visit: Payer: Self-pay

## 2021-04-09 VITALS — BP 136/73 | HR 92

## 2021-04-09 DIAGNOSIS — R159 Full incontinence of feces: Secondary | ICD-10-CM

## 2021-04-09 DIAGNOSIS — N3281 Overactive bladder: Secondary | ICD-10-CM

## 2021-04-09 MED ORDER — MIRABEGRON ER 50 MG PO TB24: 50.0000 mg | ORAL_TABLET | Freq: Every day | ORAL | 5 refills | Status: DC

## 2021-04-09 NOTE — Progress Notes (Signed)
[facility] Return Visit  SUBJECTIVE  History of Present Illness: Valerie Ho is a 81 y.o. female seen in follow-up for OAB and fecal incontinence. Plan at last visit was to start Myrbetriq 25mg  daily and metamucil daily.   Having very little leakage. She is very happy with the medication. No longer having large bouts of leakage. And urinary frequency has improved. However, it costs $100 a month. She is also doing her kegel exercises.   She is not having any more bowel leakage- taking metamucil every day.  Past Medical History: Patient  has a past medical history of BCC (basal cell carcinoma of skin), Cataract, Compression fracture of L2 ([place]) (2014), Hyperlipidemia, Hypothyroid, Lactose intolerance, and Osteopenia.   Past Surgical History: She  has a past surgical history that includes Back surgery (1988).   Medications: She has a current medication list which includes the following prescription(s): calcium-vitamin d, vitamin d, coq-10, ibuprofen, levothyroxine, fish oil, and mirabegron er.   Allergies: Patient is allergic to oxycodone and propoxyphene n-acetaminophen.   Social History: Patient  reports that she has never smoked. She has never used smokeless tobacco. She reports that she does not drink alcohol and does not use drugs.      OBJECTIVE     Physical Exam: Vitals:   04/09/21 1150  BP: 136/73  Pulse: 92   Gen: No apparent distress, A&O x 3.  Detailed Urogynecologic Evaluation:  Deferred.    ASSESSMENT AND PLAN    Valerie Ho is a 81 y.o. with:  1. Overactive bladder   2. Incontinence of feces, unspecified fecal incontinence type    OAB - Improved with myrbetriq. Listed alternative medication options for her to call her insurance company to see if there is one less expensive. But will continue the Myrbetriq for now, refills provided.   2. FI - improved with metamucil  Return 3-4 months for follow up.   Jaquita Folds, MD   Time  spent: I spent 20 minutes dedicated to the care of this patient on the date of this encounter to include pre-visit review of records, face-to-face time with the patient and post visit documentation and ordering medication/ testing.

## 2021-04-09 NOTE — Patient Instructions (Addendum)
Medication options for overactive bladder:   - Myrbetriq - Gemtesa - Trospium chloride

## 2021-04-30 ENCOUNTER — Ambulatory Visit
Admission: RE | Admit: 2021-04-30 | Discharge: 2021-04-30 | Disposition: A | Discharge status: Home / Self Care | Payer: Medicare HMO | Source: Ambulatory Visit | Attending: Family Medicine | Admitting: Family Medicine

## 2021-04-30 ENCOUNTER — Other Ambulatory Visit: Payer: Self-pay

## 2021-04-30 DIAGNOSIS — Z1231 Encounter for screening mammogram for malignant neoplasm of breast: Secondary | ICD-10-CM | POA: Diagnosis not present

## 2021-05-08 ENCOUNTER — Other Ambulatory Visit: Payer: Medicare HMO

## 2021-05-23 ENCOUNTER — Other Ambulatory Visit: Payer: Self-pay

## 2021-05-23 ENCOUNTER — Other Ambulatory Visit (INDEPENDENT_AMBULATORY_CARE_PROVIDER_SITE_OTHER): Payer: Medicare HMO

## 2021-05-23 DIAGNOSIS — E039 Hypothyroidism, unspecified: Secondary | ICD-10-CM | POA: Diagnosis not present

## 2021-05-23 LAB — TSH: TSH: 1.32 u[IU]/mL (ref 0.35–5.50)

## 2021-05-29 ENCOUNTER — Ambulatory Visit (INDEPENDENT_AMBULATORY_CARE_PROVIDER_SITE_OTHER): Payer: Medicare HMO

## 2021-05-29 VITALS — Ht 62.0 in | Wt 148.0 lb

## 2021-05-29 DIAGNOSIS — Z0000 Encounter for general adult medical examination without abnormal findings: Secondary | ICD-10-CM

## 2021-05-29 NOTE — Patient Instructions (Signed)
Valerie Ho , Thank you for taking time to complete your Medicare Wellness Visit. I appreciate your ongoing commitment to your health goals. Please review the following plan we discussed and let me know if I can assist you in the future.   Screening recommendations/referrals: Colonoscopy: no longer required  Mammogram: up to date, completed 04/30/21, due 04/30/22 Bone Density: due, per our conversation you no longer do bone density screenings Recommended yearly ophthalmology/optometry visit for glaucoma screening and checkup Recommended yearly dental visit for hygiene and checkup  Vaccinations: Influenza vaccine: up to date, please provide vaccine information when available Pneumococcal vaccine: up to date Tdap vaccine: due, last completed 2009 Shingles vaccine: Discuss with pharmacy   Covid-19:newest booster available at your local pharmacy  Advanced directives: copy on file  Conditions/risks identified: see problem list   Next appointment: Follow up in one year for your annual wellness visit 05/30/22 @ 8:15am, this will be a telephone visit    Preventive Care 65 Years and Older, Female Preventive care refers to lifestyle choices and visits with your health care provider that can promote health and wellness. What does preventive care include? A yearly physical exam. This is also called an annual well check. Dental exams once or twice a year. Routine eye exams. Ask your health care provider how often you should have your eyes checked. Personal lifestyle choices, including: Daily care of your teeth and gums. Regular physical activity. Eating a healthy diet. Avoiding tobacco and drug use. Limiting alcohol use. Practicing safe sex. Taking low-dose aspirin every day. Taking vitamin and mineral supplements as recommended by your health care provider. What happens during an annual well check? The services and screenings done by your health care provider during your annual well check will  depend on your age, overall health, lifestyle risk factors, and family history of disease. Counseling  Your health care provider may ask you questions about your: Alcohol use. Tobacco use. Drug use. Emotional well-being. Home and relationship well-being. Sexual activity. Eating habits. History of falls. Memory and ability to understand (cognition). Work and work Statistician. Reproductive health. Screening  You may have the following tests or measurements: Height, weight, and BMI. Blood pressure. Lipid and cholesterol levels. These may be checked every 5 years, or more frequently if you are over 36 years old. Skin check. Lung cancer screening. You may have this screening every year starting at age 1 if you have a 30-pack-year history of smoking and currently smoke or have quit within the past 15 years. Fecal occult blood test (FOBT) of the stool. You may have this test every year starting at age 84. Flexible sigmoidoscopy or colonoscopy. You may have a sigmoidoscopy every 5 years or a colonoscopy every 10 years starting at age 79. Hepatitis C blood test. Hepatitis B blood test. Sexually transmitted disease (STD) testing. Diabetes screening. This is done by checking your blood sugar (glucose) after you have not eaten for a while (fasting). You may have this done every 1-3 years. Bone density scan. This is done to screen for osteoporosis. You may have this done starting at age 81. Mammogram. This may be done every 1-2 years. Talk to your health care provider about how often you should have regular mammograms. Talk with your health care provider about your test results, treatment options, and if necessary, the need for more tests. Vaccines  Your health care provider may recommend certain vaccines, such as: Influenza vaccine. This is recommended every year. Tetanus, diphtheria, and acellular pertussis (Tdap, Td) vaccine.  You may need a Td booster every 10 years. Zoster vaccine. You may  need this after age 26. Pneumococcal 13-valent conjugate (PCV13) vaccine. One dose is recommended after age 36. Pneumococcal polysaccharide (PPSV23) vaccine. One dose is recommended after age 12. Talk to your health care provider about which screenings and vaccines you need and how often you need them. This information is not intended to replace advice given to you by your health care provider. Make sure you discuss any questions you have with your health care provider. Document Released: 04/20/2015 Document Revised: 12/12/2015 Document Reviewed: 01/23/2015 Elsevier Interactive Patient Education  2017 [place] Prevention in the Home Falls can cause injuries. They can happen to people of all ages. There are many things you can do to make your home safe and to help prevent falls. What can I do on the outside of my home? Regularly fix the edges of walkways and driveways and fix any cracks. Remove anything that might make you trip as you walk through a door, such as a raised step or threshold. Trim any bushes or trees on the path to your home. Use bright outdoor lighting. Clear any walking paths of anything that might make someone trip, such as rocks or tools. Regularly check to see if handrails are loose or broken. Make sure that both sides of any steps have handrails. Any raised decks and porches should have guardrails on the edges. Have any leaves, snow, or ice cleared regularly. Use sand or salt on walking paths during winter. Clean up any spills in your garage right away. This includes oil or grease spills. What can I do in the bathroom? Use night lights. Install grab bars by the toilet and in the tub and shower. Do not use towel bars as grab bars. Use non-skid mats or decals in the tub or shower. If you need to sit down in the shower, use a plastic, non-slip stool. Keep the floor dry. Clean up any water that spills on the floor as soon as it happens. Remove soap buildup in  the tub or shower regularly. Attach bath mats securely with double-sided non-slip rug tape. Do not have throw rugs and other things on the floor that can make you trip. What can I do in the bedroom? Use night lights. Make sure that you have a light by your bed that is easy to reach. Do not use any sheets or blankets that are too big for your bed. They should not hang down onto the floor. Have a firm chair that has side arms. You can use this for support while you get dressed. Do not have throw rugs and other things on the floor that can make you trip. What can I do in the kitchen? Clean up any spills right away. Avoid walking on wet floors. Keep items that you use a lot in easy-to-reach places. If you need to reach something above you, use a strong step stool that has a grab bar. Keep electrical cords out of the way. Do not use floor polish or wax that makes floors slippery. If you must use wax, use non-skid floor wax. Do not have throw rugs and other things on the floor that can make you trip. What can I do with my stairs? Do not leave any items on the stairs. Make sure that there are handrails on both sides of the stairs and use them. Fix handrails that are broken or loose. Make sure that handrails are as long as  the stairways. Check any carpeting to make sure that it is firmly attached to the stairs. Fix any carpet that is loose or worn. Avoid having throw rugs at the top or bottom of the stairs. If you do have throw rugs, attach them to the floor with carpet tape. Make sure that you have a light switch at the top of the stairs and the bottom of the stairs. If you do not have them, ask someone to add them for you. What else can I do to help prevent falls? Wear shoes that: Do not have high heels. Have rubber bottoms. Are comfortable and fit you well. Are closed at the toe. Do not wear sandals. If you use a stepladder: Make sure that it is fully opened. Do not climb a closed  stepladder. Make sure that both sides of the stepladder are locked into place. Ask someone to hold it for you, if possible. Clearly mark and make sure that you can see: Any grab bars or handrails. First and last steps. Where the edge of each step is. Use tools that help you move around (mobility aids) if they are needed. These include: Canes. Walkers. Scooters. Crutches. Turn on the lights when you go into a dark area. Replace any light bulbs as soon as they burn out. Set up your furniture so you have a clear path. Avoid moving your furniture around. If any of your floors are uneven, fix them. If there are any pets around you, be aware of where they are. Review your medicines with your doctor. Some medicines can make you feel dizzy. This can increase your chance of falling. Ask your doctor what other things that you can do to help prevent falls. This information is not intended to replace advice given to you by your health care provider. Make sure you discuss any questions you have with your health care provider. Document Released: 01/18/2009 Document Revised: 08/30/2015 Document Reviewed: 04/28/2014 Elsevier Interactive Patient Education  2017 [employer].

## 2021-05-29 NOTE — Progress Notes (Signed)
Subjective:   Valerie Ho is a 81 y.o. female who presents for Medicare Annual (Subsequent) preventive examination.  I connected with Valerie Ho  today by telephone and verified that I am speaking with the correct person using two identifiers. Location patient: home Location provider: work Persons participating in the virtual visit: patient, Marine scientist.    I discussed the limitations, risks, security and privacy concerns of performing an evaluation and management service by telephone and the availability of in person appointments. I also discussed with the patient that there may be a patient responsible charge related to this service. The patient expressed understanding and verbally consented to this telephonic visit.    Interactive audio and video telecommunications were attempted between this provider and patient, however failed, due to patient having technical difficulties OR patient did not have access to video capability.  We continued and completed visit with audio only.  Some vital signs may be absent or patient reported.   Time Spent with patient on telephone encounter: 20 minutes  Review of Systems     Cardiac Risk Factors include: advanced age (>57men, >82 women)     Objective:    Today's Vitals   05/29/21 0815  Weight: 148 lb (67.1 kg)  Height: 5\' 2"  (1.575 m)   Body mass index is 27.07 kg/m.  Advanced Directives 05/29/2021 11/08/2019 08/23/2018 08/12/2017 08/08/2016  Does Patient Have a Medical Advance Directive? Yes Yes Yes Yes No  Type of Paramedic of [place];Living will [place];Living will [place];Living will [place];Living will -  Does patient want to make changes to medical advance directive? Yes (MAU/Ambulatory/Procedural Areas - Information given) - - - -  Copy of [place] in Chart? Yes - validated most recent copy scanned in chart (See row information) Yes -  validated most recent copy scanned in chart (See row information) Yes - validated most recent copy scanned in chart (See row information) No - copy requested -    Current Medications (verified) Outpatient Encounter Medications as of 05/29/2021  Medication Sig   Calcium Carbonate-Vitamin D (CALCIUM-VITAMIN D) 600-200 MG-UNIT CAPS Take 1 tablet by mouth 2 (two) times daily.     Cholecalciferol (VITAMIN D) 1000 UNITS capsule Take 1,000 Units by mouth daily.     Coenzyme Q10 (COQ-10) 200 MG CAPS Take 1 capsule by mouth daily.   Ibuprofen 200 MG CAPS Take 1-2 capsules by mouth at bedtime as needed.   levothyroxine (EUTHYROX) 75 MCG tablet Take 1 tablet (75 mcg total) by mouth daily.   mirabegron ER (MYRBETRIQ) 50 MG TB24 tablet Take 1 tablet (50 mg total) by mouth daily.   Omega-3 Fatty Acids (FISH OIL) 1000 MG CAPS Take 1,000 capsules by mouth daily.   No facility-administered encounter medications on file as of 05/29/2021.    Allergies (verified) Oxycodone and Propoxyphene n-acetaminophen   History: Past Medical History:  Diagnosis Date   BCC (basal cell carcinoma of skin)    Cataract    Compression fracture of L2 ([place]) 2014   after a fall    Hyperlipidemia    Hypothyroid    Lactose intolerance    Osteopenia    Past Surgical History:  Procedure Laterality Date   BACK SURGERY  1988   lumbar disc surgery   Family History  Problem Relation Age of Onset   Heart disease Mother        chf   Cancer Paternal Uncle  Diabetes Maternal Grandfather    Colon cancer Neg Hx    Breast cancer Neg Hx    Social History   Socioeconomic History   Marital status: Married    Spouse name: Not on file   Number of children: Not on file   Years of education: Not on file   Highest education level: Not on file  Occupational History   Not on file  Tobacco Use   Smoking status: Never   Smokeless tobacco: Never  Vaping Use   Vaping Use: Never used  Substance and Sexual Activity   Alcohol  use: No    Alcohol/week: 0.0 standard drinks   Drug use: No   Sexual activity: Not Currently  Other Topics Concern   Not on file  Social History Narrative   From [place]   Retired from [employer], [employer]   Enjoys knitting, reading, crosswords   Married 1961   Social Determinants of Radio broadcast assistant Strain: Low Risk    Difficulty of Paying Living Expenses: Not hard at all  Food Insecurity: No Food Insecurity   Worried About Charity fundraiser in the Last Year: Never true   Arboriculturist in the Last Year: Never true  Transportation Needs: No Transportation Needs   Lack of Transportation (Medical): No   Lack of Transportation (Non-Medical): No  Physical Activity: Sufficiently Active   Days of Exercise per Week: 7 days   Minutes of Exercise per Session: 60 min  Stress: No Stress Concern Present   Feeling of Stress : Not at all  Social Connections: Socially Integrated   Frequency of Communication with Friends and Family: More than three times a week   Frequency of Social Gatherings with Friends and Family: More than three times a week   Attends Religious Services: More than 4 times per year   Active Member of [employer] or Organizations: Yes   Attends Music therapist: More than 4 times per year   Marital Status: Married    Tobacco Counseling Counseling given: Not Answered   Clinical Intake:  Pre-visit preparation completed: Yes  Pain : No/denies pain     BMI - recorded: 27.07 Nutritional Status: BMI 25 -29 Overweight Nutritional Risks: None Diabetes: No  How often do you need to have someone help you when you read instructions, pamphlets, or other written materials from your doctor or pharmacy?: 1 - Never  Diabetic?No  Interpreter Needed?: No  Information entered by :: Orrin Brigham LPN   Activities of Daily Living In your present state of health, do you have any difficulty performing the following activities: 05/29/2021   Hearing? Y  Comment wears hearing aids  Vision? N  Difficulty concentrating or making decisions? N  Walking or climbing stairs? N  Dressing or bathing? N  Doing errands, shopping? N  Preparing Food and eating ? N  Using the Toilet? N  In the past six months, have you accidently leaked urine? Y  Comment taking Myrbetriq  Do you have problems with loss of bowel control? N  Managing your Medications? N  Managing your Finances? N  Housekeeping or managing your Housekeeping? N  Some recent data might be hidden    Patient Care Team: Tonia Ghent, MD as PCP - General (Family Medicine) Katy Apo, MD as Consulting Physician (Ophthalmology)  Indicate any recent Medical Services you may have received from other than [facility] providers in the past year (date may be approximate).  Assessment:   This is a routine wellness examination for Valerie Ho.  Hearing/Vision screen Hearing Screening - Comments:: Wears hearing aids  Vision Screening - Comments:: Last exam 08/2020, patient has an upcoming appointment, wears readers, Dr. Prudencio Burly   Dietary issues and exercise activities discussed: Current Exercise Habits: Home exercise routine, Type of exercise: Other - see comments;walking (walks stairs at home, feeds goats, yard work), Time (Minutes): 60, Frequency (Times/Week): 7, Weekly Exercise (Minutes/Week): 420, Intensity: Moderate   Goals Addressed             This Visit's Progress    Patient Stated       Would like maintain current routine       Depression Screen PHQ 2/9 Scores 05/29/2021 12/31/2020 11/08/2019 08/23/2018 08/19/2018 08/12/2017 08/08/2016  PHQ - 2 Score 0 0 0 0 0 0 0  PHQ- 9 Score - - 0 0 0 0 -    Fall Risk Fall Risk  05/29/2021 12/31/2020 11/08/2019 08/23/2018 08/19/2018  Falls in the past year? 0 0 0 1 0  Comment - - - Fall 2019 tripped over rug -  Number falls in past yr: 0 0 0 0 -  Injury with Fall? 0 0 0 1 -  Risk for fall due to : No Fall Risks No Fall Risks No Fall  Risks - -  Follow up Falls prevention discussed Falls evaluation completed Falls evaluation completed;Falls prevention discussed - -    FALL RISK PREVENTION PERTAINING TO THE HOME:  Any stairs in or around the home? Yes  If so, are there any without handrails? No  Home free of loose throw rugs in walkways, pet beds, electrical cords, etc? Yes  Adequate lighting in your home to reduce risk of falls? Yes   ASSISTIVE DEVICES UTILIZED TO PREVENT FALLS:  Life alert? No  Use of a cane, walker or w/c? No  Grab bars in the bathroom? No  Shower chair or bench in shower? Yes  Elevated toilet seat or a handicapped toilet? Yes   TIMED UP AND GO:  Was the test performed? No .    Cognitive Function: Normal cognitive status assessed by this Nurse Health Advisor. No abnormalities found.   MMSE - Mini Mental State Exam 11/08/2019 08/23/2018 08/19/2018 08/12/2017 08/08/2016  Orientation to time 5 5 5 5 5   Orientation to Place 5 5 5 5 5   Registration 3 3 3 3 3   Attention/ Calculation 5 0 0 0 0  Recall 3 3 3 3 3   Language- name 2 objects - 0 0 0 0  Language- repeat 1 1 1 1 1   Language- follow 3 step command - 0 0 3 3  Language- read & follow direction - 0 0 0 0  Write a sentence - 0 0 0 0  Copy design - 0 0 0 0  Total score - 17 17 20 20         Immunizations Immunization History  Administered Date(s) Administered   Influenza Split 01/20/2012   Influenza, High Dose Seasonal PF 02/06/2017   Influenza,inj,Quad PF,6+ Mos 03/08/2013, 03/14/2016   PFIZER(Purple Top)SARS-COV-2 Vaccination 05/02/2019, 05/23/2019   Pneumococcal Conjugate-13 07/25/2014   Pneumococcal Polysaccharide-23 01/20/2012   Td 12/30/2007    TDAP status: Due, Education has been provided regarding the importance of this vaccine. Advised may receive this vaccine at local pharmacy or Health Dept. Aware to provide a copy of the vaccination record if obtained from local pharmacy or Health Dept. Verbalized acceptance and  understanding.  Flu Vaccine  status: Up to date  Pneumococcal vaccine status: Up to date  Covid-19 vaccine status: Information provided on how to obtain vaccines.   Qualifies for Shingles Vaccine? No   Zostavax completed No   Shingrix Completed?: No.    Education has been provided regarding the importance of this vaccine. Patient has been advised to call insurance company to determine out of pocket expense if they have not yet received this vaccine. Advised may also receive vaccine at local pharmacy or Health Dept. Verbalized acceptance and understanding.  Screening Tests Health Maintenance  Topic Date Due   Zoster Vaccines- Shingrix (1 of 2) Never done   TETANUS/TDAP  12/29/2017   COVID-19 Vaccine (3 - Booster for Pfizer series) 07/18/2019   INFLUENZA VACCINE  11/05/2020   MAMMOGRAM  04/30/2022   Pneumonia Vaccine 58+ Years old  Completed   DEXA SCAN  Completed   HPV VACCINES  Aged Out    Health Maintenance  Health Maintenance Due  Topic Date Due   Zoster Vaccines- Shingrix (1 of 2) Never done   TETANUS/TDAP  12/29/2017   COVID-19 Vaccine (3 - Booster for Pfizer series) 07/18/2019   INFLUENZA VACCINE  11/05/2020    Colorectal cancer screening: No longer required.   Mammogram status: Completed 04/30/21. Repeat every year  Bone Density: Patient declined today, will discuss with PCP, when decided   Lung Cancer Screening: (Low Dose CT Chest recommended if Age 61-80 years, 30 pack-year currently smoking OR have quit w/in 15years.) does not qualify.     Additional Screening:  Hepatitis C Screening: does not qualify  Vision Screening: Recommended annual ophthalmology exams for early detection of glaucoma and other disorders of the eye. Is the patient up to date with their annual eye exam?  Yes  Who is the provider or what is the name of the office in which the patient attends annual eye exams? Dr. Prudencio Burly    Dental Screening: Recommended annual dental exams for proper oral  hygiene  Community Resource Referral / Chronic Care Management: CRR required this visit?  No   CCM required this visit?  No      Plan:     I have personally reviewed and noted the following in the patients chart:   Medical and social history Use of alcohol, tobacco or illicit drugs  Current medications and supplements including opioid prescriptions.  Functional ability and status Nutritional status Physical activity Advanced directives List of other physicians Hospitalizations, surgeries, and ER visits in previous 12 months Vitals Screenings to include cognitive, depression, and falls Referrals and appointments  In addition, I have reviewed and discussed with patient certain preventive protocols, quality metrics, and best practice recommendations. A written personalized care plan for preventive services as well as general preventive health recommendations were provided to patient.   Due to this being a telephonic visit, the after visit summary with patients personalized plan was offered to patient via mail or my-chart.  Patient would like to access on my-chart.   Loma Messing, LPN  [id number]   Nurse Health Advisor  Nurse Notes: patient plans to provide update vaccine information

## 2021-05-30 DIAGNOSIS — L538 Other specified erythematous conditions: Secondary | ICD-10-CM | POA: Diagnosis not present

## 2021-05-30 DIAGNOSIS — Z85828 Personal history of other malignant neoplasm of skin: Secondary | ICD-10-CM | POA: Diagnosis not present

## 2021-05-30 DIAGNOSIS — L821 Other seborrheic keratosis: Secondary | ICD-10-CM | POA: Diagnosis not present

## 2021-05-30 DIAGNOSIS — L308 Other specified dermatitis: Secondary | ICD-10-CM | POA: Diagnosis not present

## 2021-05-30 DIAGNOSIS — L298 Other pruritus: Secondary | ICD-10-CM | POA: Diagnosis not present

## 2021-05-30 DIAGNOSIS — Z08 Encounter for follow-up examination after completed treatment for malignant neoplasm: Secondary | ICD-10-CM | POA: Diagnosis not present

## 2021-07-08 NOTE — Progress Notes (Signed)
[facility] ?Return Visit ? ?SUBJECTIVE  ?History of Present Illness: ?Valerie Ho is a 81 y.o. female seen in follow-up for OAB and fecal incontinence. She is on Myrbetriq 25 mg and has been taking metamucil.  ? ?Has been taking myrbetriq for a few days and then has been skipping one day. She tried every other day and it did not work as well for her. Has not needed a pad in weeks. ?Has been taking the metamucil wafers. Not having bowel leakage.  ? ?Past Medical History: ?Patient  has a past medical history of BCC (basal cell carcinoma of skin), Cataract, Compression fracture of L2 (Valerie Ho) (2014), Hyperlipidemia, Hypothyroid, Lactose intolerance, and Osteopenia.  ? ?Past Surgical History: ?She  has a past surgical history that includes Back surgery (1988).  ? ?Medications: ?She has a current medication list which includes the following prescription(s): calcium-vitamin d, vitamin d, coq-10, ibuprofen, levothyroxine, mirabegron er, and fish oil.  ? ?Allergies: ?Patient is allergic to oxycodone and propoxyphene n-acetaminophen.  ? ?Social History: ?Patient  reports that she has never smoked. She has never used smokeless tobacco. She reports that she does not drink alcohol and does not use drugs.  ?  ?  ?OBJECTIVE  ?  ? ?Physical Exam: ?Vitals:  ? 07/09/21 0921  ?BP: 136/74  ?Pulse: 83  ? ?Gen: No apparent distress, A&O x 3. ? ?Detailed Urogynecologic Evaluation:  ?Deferred.   ? ?ASSESSMENT AND PLAN  ?  ?[name] is a 81 y.o. with:  ?1. Overactive bladder   ? ?- Continue with Myrbetriq '25mg'$ . She can continue to wean down and assess her symptoms. We discussed eventually that she may be able to come off completely without return of symptoms.  ?- All medications are a tier 4 for her. Can try for a tier reduction. Will contact insurance ? ?Return 6 months or sooner if needed ? ?Jaquita Folds, MD ? ? ?Time spent: I spent 20 minutes dedicated to the care of this patient on the date of this encounter to  include pre-visit review of records, face-to-face time with the patient and post visit documentation. ? ?

## 2021-07-09 ENCOUNTER — Encounter: Payer: Self-pay | Admitting: Obstetrics and Gynecology

## 2021-07-09 ENCOUNTER — Ambulatory Visit: Payer: Medicare HMO | Admitting: Obstetrics and Gynecology

## 2021-07-09 VITALS — BP 136/74 | HR 83

## 2021-07-09 DIAGNOSIS — N3281 Overactive bladder: Secondary | ICD-10-CM | POA: Diagnosis not present

## 2021-07-11 NOTE — Progress Notes (Unsigned)
Spoke to [facility] at [facility] for a Teir reduction request. ?Request has been submitted for Myrbetriq '50mg'$ . ?Teir reduction was APPROVED and reduced to a teir 3. ?Auth from: 07/11/21- 04/06/2022. ?Approved Case # [id number] ? ?Pt will be notified ?

## 2021-08-16 DIAGNOSIS — H2513 Age-related nuclear cataract, bilateral: Secondary | ICD-10-CM | POA: Diagnosis not present

## 2021-08-16 DIAGNOSIS — H52203 Unspecified astigmatism, bilateral: Secondary | ICD-10-CM | POA: Diagnosis not present

## 2021-08-16 DIAGNOSIS — H25013 Cortical age-related cataract, bilateral: Secondary | ICD-10-CM | POA: Diagnosis not present

## 2021-09-12 DIAGNOSIS — L821 Other seborrheic keratosis: Secondary | ICD-10-CM | POA: Diagnosis not present

## 2021-09-12 DIAGNOSIS — S40261A Insect bite (nonvenomous) of right shoulder, initial encounter: Secondary | ICD-10-CM | POA: Diagnosis not present

## 2021-11-22 ENCOUNTER — Telehealth: Payer: Self-pay | Admitting: Family Medicine

## 2021-11-22 NOTE — Telephone Encounter (Signed)
Advised patient that we do not carry the vaccine and not sure if or when we will since it is so new. She stated that [place] has the vaccine and wanted to know if Dr. Damita Dunnings recommends her getting it?

## 2021-11-22 NOTE — Telephone Encounter (Signed)
Patient called in asking about the RSV  vaccine,she would like to know would DR. Damita Dunnings recommend her receiving this vaccine?Please advise.

## 2021-11-24 NOTE — Telephone Encounter (Signed)
I would hold off for now given that she is not in the highest risk group.  I would get a flu shot done this fall.  Thanks.

## 2021-11-25 NOTE — Telephone Encounter (Signed)
LMTCB

## 2021-11-27 NOTE — Telephone Encounter (Signed)
Patient notified RSV vaccine is not needed at this time and to make sure and get flu shot this fall.

## 2022-01-21 ENCOUNTER — Ambulatory Visit: Payer: Medicare HMO | Admitting: Obstetrics and Gynecology

## 2022-01-26 ENCOUNTER — Other Ambulatory Visit: Payer: Self-pay | Admitting: Family Medicine

## 2022-01-27 ENCOUNTER — Encounter: Payer: Self-pay | Admitting: *Deleted

## 2022-01-27 NOTE — Telephone Encounter (Signed)
Refill request for Levothyroxine Sodium 75 MCG Oral Tablet  LOV - 12/31/20 Next OV - not scheduled Last refill - 12/31/20 #90/3

## 2022-01-27 NOTE — Telephone Encounter (Signed)
Patient is overdue for AWV; please call to schedule appt

## 2022-01-27 NOTE — Telephone Encounter (Signed)
Patient has been scheduled

## 2022-01-30 ENCOUNTER — Ambulatory Visit (INDEPENDENT_AMBULATORY_CARE_PROVIDER_SITE_OTHER): Payer: Medicare HMO | Admitting: Obstetrics and Gynecology

## 2022-01-30 ENCOUNTER — Encounter: Payer: Self-pay | Admitting: Obstetrics and Gynecology

## 2022-01-30 VITALS — BP 126/73 | HR 78

## 2022-01-30 DIAGNOSIS — N3281 Overactive bladder: Secondary | ICD-10-CM

## 2022-01-30 DIAGNOSIS — R159 Full incontinence of feces: Secondary | ICD-10-CM | POA: Diagnosis not present

## 2022-01-30 MED ORDER — MIRABEGRON ER 50 MG PO TB24: 50.0000 mg | ORAL_TABLET | Freq: Every day | ORAL | 11 refills | Status: DC

## 2022-01-30 NOTE — Progress Notes (Signed)
[facility] Return Visit  SUBJECTIVE  History of Present Illness: Valerie Ho is a 81 y.o. female seen in follow-up for OAB and fecal incontinence. She is on Myrbetriq 50 mg and has been taking metamucil.   Mybetriq has been working very well for her. She is not using poise pads anymore, does have occasional leakage. Does not have frequency. Urinates once at night. Was able to get a tier reduction and the medication cost her $47. As of this month, she met her deductible and the price was increased again until next year.   Has not any bowel leakage with metamucil.   Past Medical History: Patient  has a past medical history of BCC (basal cell carcinoma of skin), Cataract, Compression fracture of L2 ([place]) (2014), Hyperlipidemia, Hypothyroid, Lactose intolerance, and Osteopenia.   Past Surgical History: She  has a past surgical history that includes Back surgery (1988).   Medications: She has a current medication list which includes the following prescription(s): calcium-vitamin d, vitamin d, coq-10, ibuprofen, levothyroxine, mirabegron er, and fish oil.   Allergies: Patient is allergic to oxycodone and propoxyphene n-acetaminophen.   Social History: Patient  reports that she has never smoked. She has never used smokeless tobacco. She reports that she does not drink alcohol and does not use drugs.      OBJECTIVE     Physical Exam: Vitals:   01/30/22 1006  BP: 126/73  Pulse: 78    Gen: No apparent distress, A&O x 3.  Detailed Urogynecologic Evaluation:  Deferred.    ASSESSMENT AND PLAN    Valerie Ho is a 81 y.o. with:  No diagnosis found.  - Continue with Myrbetriq 57m, refill provided for one year. Samples given to get her to end of year. May need to do tier reduction again.  - Continue with metamucil.   Return 1 year or sooner if needed  MJaquita Folds MD   Time spent: I spent 20 minutes dedicated to the care of this patient on the date of  this encounter to include pre-visit review of records, face-to-face time with the patient and post visit documentation.

## 2022-01-31 ENCOUNTER — Ambulatory Visit: Payer: Medicare HMO | Admitting: Obstetrics and Gynecology

## 2022-02-16 ENCOUNTER — Other Ambulatory Visit: Payer: Self-pay | Admitting: Family Medicine

## 2022-02-16 DIAGNOSIS — E039 Hypothyroidism, unspecified: Secondary | ICD-10-CM

## 2022-02-16 DIAGNOSIS — E785 Hyperlipidemia, unspecified: Secondary | ICD-10-CM

## 2022-02-16 DIAGNOSIS — M8580 Other specified disorders of bone density and structure, unspecified site: Secondary | ICD-10-CM

## 2022-02-17 ENCOUNTER — Other Ambulatory Visit: Payer: Medicare HMO

## 2022-02-18 ENCOUNTER — Other Ambulatory Visit (INDEPENDENT_AMBULATORY_CARE_PROVIDER_SITE_OTHER): Payer: Medicare HMO

## 2022-02-18 DIAGNOSIS — M8580 Other specified disorders of bone density and structure, unspecified site: Secondary | ICD-10-CM

## 2022-02-18 DIAGNOSIS — E039 Hypothyroidism, unspecified: Secondary | ICD-10-CM | POA: Diagnosis not present

## 2022-02-18 DIAGNOSIS — E785 Hyperlipidemia, unspecified: Secondary | ICD-10-CM

## 2022-02-18 LAB — COMPREHENSIVE METABOLIC PANEL
ALT: 12 U/L (ref 0–35)
AST: 18 U/L (ref 0–37)
Albumin: 3.9 g/dL (ref 3.5–5.2)
Alkaline Phosphatase: 76 U/L (ref 39–117)
BUN: 11 mg/dL (ref 6–23)
CO2: 30 mEq/L (ref 19–32)
Calcium: 8.8 mg/dL (ref 8.4–10.5)
Chloride: 103 mEq/L (ref 96–112)
Creatinine, Ser: 0.87 mg/dL (ref 0.40–1.20)
GFR: 62.48 mL/min (ref >60.00)
Glucose, Bld: 85 mg/dL (ref 70–99)
Potassium: 4.5 mEq/L (ref 3.5–5.1)
Sodium: 137 mEq/L (ref 135–145)
Total Bilirubin: 0.4 mg/dL (ref 0.2–1.2)
Total Protein: 6.9 g/dL (ref 6.0–8.3)

## 2022-02-18 LAB — LIPID PANEL
Cholesterol: 199 mg/dL (ref 0–200)
HDL: 41.2 mg/dL (ref >39.00)
LDL Cholesterol: 128 mg/dL — ABNORMAL HIGH (ref 0–99)
NonHDL: 157.38
Total CHOL/HDL Ratio: 5
Triglycerides: 145 mg/dL (ref 0.0–149.0)
VLDL: 29 mg/dL (ref 0.0–40.0)

## 2022-02-18 LAB — VITAMIN D 25 HYDROXY (VIT D DEFICIENCY, FRACTURES): VITD: 44.44 ng/mL (ref 30.00–100.00)

## 2022-02-18 LAB — TSH: TSH: 2.61 u[IU]/mL (ref 0.35–5.50)

## 2022-02-24 ENCOUNTER — Encounter: Payer: Medicare HMO | Admitting: Family Medicine

## 2022-02-25 ENCOUNTER — Ambulatory Visit (INDEPENDENT_AMBULATORY_CARE_PROVIDER_SITE_OTHER): Payer: Medicare HMO | Admitting: Family Medicine

## 2022-02-25 ENCOUNTER — Encounter: Payer: Self-pay | Admitting: Family Medicine

## 2022-02-25 VITALS — BP 120/64 | HR 79 | Temp 98.0°F | Ht 62.0 in | Wt 150.4 lb

## 2022-02-25 DIAGNOSIS — E039 Hypothyroidism, unspecified: Secondary | ICD-10-CM

## 2022-02-25 DIAGNOSIS — Z85828 Personal history of other malignant neoplasm of skin: Secondary | ICD-10-CM

## 2022-02-25 DIAGNOSIS — Z0000 Encounter for general adult medical examination without abnormal findings: Secondary | ICD-10-CM

## 2022-02-25 DIAGNOSIS — Z7189 Other specified counseling: Secondary | ICD-10-CM

## 2022-02-25 DIAGNOSIS — N393 Stress incontinence (female) (male): Secondary | ICD-10-CM

## 2022-02-25 NOTE — Progress Notes (Unsigned)
Hypothyroidism.  Compliant with replacement.  No neck mass, no dysphagia.  TSH wnl.    She had seen urogyn and is on myrbetriq.  It helped in the meantime. No ADE on med.    She wanted to get set up with dermatology in [place].  Referral placed.  Mammogram 2023 DXA declined.  She wouldn't want to go through treatment.   Tetanus 2009  shingles 2023, 1st dose done.   PNA and covid prev done.  Flu 2023 Advance directive- would have her husband then both of her kids designated if patient were incapacitated.   She got hearing aids through [facility].    Meds, vitals, and allergies reviewed.   ROS: Per HPI unless specifically indicated in ROS section   GEN: nad, alert and oriented HEENT: MMM NECK: supple w/o LA CV: rrr.  PULM: ctab, no inc wob ABD: soft, +bs EXT: no edema SKIN: no acute rash

## 2022-02-25 NOTE — Patient Instructions (Addendum)
You should get a a call about dermatology in [place].   Thanks for your effort.  Take care.  Glad to see you. Update me as needed.

## 2022-02-26 NOTE — Assessment & Plan Note (Signed)
Mammogram 2023 DXA declined.  She wouldn't want to go through treatment.   Tetanus 2009  shingles 2023, 1st dose done.   PNA and covid prev done.  Flu 2023 Advance directive- would have her husband then both of her kids designated if patient were incapacitated.

## 2022-02-26 NOTE — Assessment & Plan Note (Signed)
Advance directive- would have her husband then both of her kids designated if patient were incapacitated.

## 2022-02-26 NOTE — Assessment & Plan Note (Signed)
Hypothyroidism.  Compliant with replacement.  No neck mass, no dysphagia.  TSH wnl.  Continue levothyroxine as is.

## 2022-02-26 NOTE — Assessment & Plan Note (Signed)
Clearly improved on Myrbetriq.

## 2022-04-15 ENCOUNTER — Other Ambulatory Visit: Payer: Self-pay | Admitting: Family Medicine

## 2022-04-15 DIAGNOSIS — Z1231 Encounter for screening mammogram for malignant neoplasm of breast: Secondary | ICD-10-CM

## 2022-04-29 NOTE — Progress Notes (Addendum)
Spoke to [employer] rep at [facility] for a Teir reduction request. Request has been submitted for Myrbetriq '50mg'$ . Tier reduction is PENDING. Case # [id number]

## 2022-05-07 NOTE — Progress Notes (Addendum)
[facility] was contacted to check the status of a PA submitted on the 23rd of Jamurary. Tier reduction was APPROVED Estimated cost will be: $47   Py was notified

## 2022-05-26 ENCOUNTER — Encounter: Payer: Self-pay | Admitting: *Deleted

## 2022-05-29 NOTE — Progress Notes (Deleted)
Subjective:   Valerie Ho is a 82 y.o. female who presents for Medicare Annual (Subsequent) preventive examination.  Review of Systems    ***       Objective:    There were no vitals filed for this visit. There is no height or weight on file to calculate BMI.     05/29/2021    8:25 AM 11/08/2019    3:32 PM 08/23/2018   10:32 AM 08/12/2017    9:26 AM 08/08/2016    9:17 AM  Advanced Directives  Does Patient Have a Medical Advance Directive? Yes Yes Yes Yes No  Type of Paramedic of [place];Living will [place];Living will [place];Living will [place];Living will   Does patient want to make changes to medical advance directive? Yes (MAU/Ambulatory/Procedural Areas - Information given)      Copy of [place] in Chart? Yes - validated most recent copy scanned in chart (See row information) Yes - validated most recent copy scanned in chart (See row information) Yes - validated most recent copy scanned in chart (See row information) No - copy requested     Current Medications (verified) Outpatient Encounter Medications as of 05/30/2022  Medication Sig   Calcium Carbonate-Vitamin D (CALCIUM-VITAMIN D) 600-200 MG-UNIT CAPS Take 1 tablet by mouth 2 (two) times daily.     Cholecalciferol (VITAMIN D) 50 MCG (2000 UT) tablet Take 2,000 Units by mouth daily.   Coenzyme Q10 (COQ-10) 200 MG CAPS Take 1 capsule by mouth daily.   Ibuprofen 200 MG CAPS Take 1-2 capsules by mouth at bedtime as needed.   levothyroxine (SYNTHROID) 75 MCG tablet Take 1 tablet by mouth once daily   mirabegron ER (MYRBETRIQ) 50 MG TB24 tablet Take 1 tablet (50 mg total) by mouth daily.   Omega-3 Fatty Acids (FISH OIL) 1000 MG CAPS Take 1,000 capsules by mouth daily.   No facility-administered encounter medications on file as of 05/30/2022.    Allergies (verified) Oxycodone and Propoxyphene n-acetaminophen    History: Past Medical History:  Diagnosis Date   BCC (basal cell carcinoma of skin)    Cataract    Compression fracture of L2 ([place]) 2014   after a fall    Hyperlipidemia    Hypothyroid    Lactose intolerance    Osteopenia    Past Surgical History:  Procedure Laterality Date   BACK SURGERY  1988   lumbar disc surgery   Family History  Problem Relation Age of Onset   Heart disease Mother        chf   Cancer Paternal Uncle    Diabetes Maternal Grandfather    Colon cancer Neg Hx    Breast cancer Neg Hx    Social History   Socioeconomic History   Marital status: Married    Spouse name: Not on file   Number of children: Not on file   Years of education: Not on file   Highest education level: Not on file  Occupational History   Not on file  Tobacco Use   Smoking status: Never   Smokeless tobacco: Never  Vaping Use   Vaping Use: Never used  Substance and Sexual Activity   Alcohol use: No    Alcohol/week: 0.0 standard drinks of alcohol   Drug use: No   Sexual activity: Not Currently  Other Topics Concern   Not on file  Social History Narrative   From [place]   Retired  from [employer], [place]   Enjoys knitting, reading, crosswords   Married 1961   Social Determinants of Health   Financial Resource Strain: Low Risk  (05/29/2021)   Overall Financial Resource Strain (CARDIA)    Difficulty of Paying Living Expenses: Not hard at all  Food Insecurity: No Food Insecurity (05/29/2021)   Hunger Vital Sign    Worried About Running Out of Food in the Last Year: Never true    Ran Out of Food in the Last Year: Never true  Transportation Needs: No Transportation Needs (05/29/2021)   PRAPARE - Hydrologist (Medical): No    Lack of Transportation (Non-Medical): No  Physical Activity: Sufficiently Active (05/29/2021)   Exercise Vital Sign    Days of Exercise per Week: 7 days    Minutes of Exercise per Session: 60 min  Stress: No  Stress Concern Present (05/29/2021)   [place]    Feeling of Stress : Not at all  Social Connections: [place] (05/29/2021)   Social Connection and Isolation Panel [NHANES]    Frequency of Communication with Friends and Family: More than three times a week    Frequency of Social Gatherings with Friends and Family: More than three times a week    Attends Religious Services: More than 4 times per year    Active Member of [employer] or Organizations: Yes    Attends Music therapist: More than 4 times per year    Marital Status: Married    Tobacco Counseling Counseling given: Not Answered   Clinical Intake:                 Diabetic?No          Activities of Daily Living     No data to display          Patient Care Team: Tonia Ghent, MD as PCP - General (Family Medicine) Katy Apo, MD as Consulting Physician (Ophthalmology)  Indicate any recent Medical Services you may have received from other than [facility] providers in the past year (date may be approximate).     Assessment:   This is a routine wellness examination for Valerie Ho.  Hearing/Vision screen No results found.  Dietary issues and exercise activities discussed:     Goals Addressed   None    Depression Screen    05/29/2021    8:26 AM 12/31/2020   12:20 PM 11/08/2019    3:36 PM 08/23/2018   10:32 AM 08/19/2018   10:05 AM 08/12/2017    9:24 AM 08/08/2016    9:17 AM  PHQ 2/9 Scores  PHQ - 2 Score 0 0 0 0 0 0 0  PHQ- 9 Score   0 0 0 0     Fall Risk    05/29/2021    8:26 AM 12/31/2020   12:20 PM 11/08/2019    3:35 PM 08/23/2018   10:32 AM 08/19/2018   10:05 AM  Fall Risk   Falls in the past year? 0 0 0 1 0  Comment    Fall 2019 tripped over rug   Number falls in past yr: 0 0 0 0   Injury with Fall? 0 0 0 1   Risk for fall due to : No Fall Risks No Fall Risks No Fall Risks    Follow up Falls prevention  discussed Falls evaluation completed Falls evaluation completed;Falls prevention discussed      FALL  RISK PREVENTION PERTAINING TO THE HOME:  Any stairs in or around the home? {YES/NO:21197} If so, are there any without handrails? {YES/NO:21197} Home free of loose throw rugs in walkways, pet beds, electrical cords, etc? {YES/NO:21197} Adequate lighting in your home to reduce risk of falls? {YES/NO:21197}  ASSISTIVE DEVICES UTILIZED TO PREVENT FALLS:  Life alert? {YES/NO:21197} Use of a cane, walker or w/c? {YES/NO:21197} Grab bars in the bathroom? {YES/NO:21197} Shower chair or bench in shower? {YES/NO:21197} Elevated toilet seat or a handicapped toilet? {YES/NO:21197}  TIMED UP AND GO:  Was the test performed? No . Telephonic visit   Cognitive Function:    11/08/2019    3:37 PM 08/23/2018   10:32 AM 08/19/2018   10:05 AM 08/12/2017    9:26 AM 08/08/2016    9:18 AM  MMSE - Mini Mental State Exam  Orientation to time 5 5 5 5 5  $ Orientation to Place 5 5 5 5 5  $ Registration 3 3 3 3 3  $ Attention/ Calculation 5 0 0 0 0  Recall 3 3 3 3 3  $ Language- name 2 objects  0 0 0 0  Language- repeat 1 1 1 1 1  $ Language- follow 3 step command  0 0 3 3  Language- read & follow direction  0 0 0 0  Write a sentence  0 0 0 0  Copy design  0 0 0 0  Total score  17 17 20 20        $ Immunizations Immunization History  Administered Date(s) Administered   Fluad Quad(high Dose 65+) 01/31/2022   Influenza Split 01/20/2012   Influenza, High Dose Seasonal PF 02/06/2017   Influenza,inj,Quad PF,6+ Mos 03/08/2013, 03/14/2016   PFIZER(Purple Top)SARS-COV-2 Vaccination 05/02/2019, 05/23/2019   Pneumococcal Conjugate-13 07/25/2014   Pneumococcal Polysaccharide-23 01/20/2012   Td 12/30/2007   Zoster Recombinat (Shingrix) 09/20/2021    TDAP status: Due, Education has been provided regarding the importance of this vaccine. Advised may receive this vaccine at local pharmacy or Health Dept. Aware to  provide a copy of the vaccination record if obtained from local pharmacy or Health Dept. Verbalized acceptance and understanding.  Flu Vaccine status: Up to date  Pneumococcal vaccine status: Up to date  Covid-19 vaccine status: Information provided on how to obtain vaccines.   Qualifies for Shingles Vaccine? Yes   Zostavax completed No   Shingrix Completed?: No.    Education has been provided regarding the importance of this vaccine. Patient has been advised to call insurance company to determine out of pocket expense if they have not yet received this vaccine. Advised may also receive vaccine at local pharmacy or Health Dept. Verbalized acceptance and understanding.  Screening Tests Health Maintenance  Topic Date Due   DTaP/Tdap/Td (2 - Tdap) 12/29/2017   Zoster Vaccines- Shingrix (2 of 2) 11/15/2021   COVID-19 Vaccine (3 - 2023-24 season) 12/06/2021   MAMMOGRAM  04/30/2022   Medicare Annual Wellness (AWV)  05/29/2022   Pneumonia Vaccine 10+ Years old  Completed   INFLUENZA VACCINE  Completed   DEXA SCAN  Completed   HPV VACCINES  Aged Out    Health Maintenance  Health Maintenance Due  Topic Date Due   DTaP/Tdap/Td (2 - Tdap) 12/29/2017   Zoster Vaccines- Shingrix (2 of 2) 11/15/2021   COVID-19 Vaccine (3 - 2023-24 season) 12/06/2021   MAMMOGRAM  04/30/2022   Medicare Annual Wellness (AWV)  05/29/2022    Colorectal cancer screening: No longer required.   Mammogram status: No longer required  due to age.  Bone Density status: Completed 10/01/17. Results reflect: Bone density results: OSTEOPENIA. Repeat every 2 years.  Lung Cancer Screening: (Low Dose CT Chest recommended if Age 49-80 years, 30 pack-year currently smoking OR have quit w/in 15years.) does not qualify.   Lung Cancer Screening Referral: n/a  Additional Screening:  Hepatitis C Screening: does not qualify  Vision Screening: Recommended annual ophthalmology exams for early detection of glaucoma and other  disorders of the eye. Is the patient up to date with their annual eye exam?  Yes  Who is the provider or what is the name of the office in which the patient attends annual eye exams? Dr. Prudencio Burly  If pt is not established with a provider, would they like to be referred to a provider to establish care? No .   Dental Screening: Recommended annual dental exams for proper oral hygiene  Community Resource Referral / Chronic Care Management: CRR required this visit?  {YES/NO:21197}  CCM required this visit?  {YES/NO:21197}     Plan:     I have personally reviewed and noted the following in the patient's chart:   Medical and social history Use of alcohol, tobacco or illicit drugs  Current medications and supplements including opioid prescriptions. {Opioid Prescriptions:2153856550} Functional ability and status Nutritional status Physical activity Advanced directives List of other physicians Hospitalizations, surgeries, and ER visits in previous 12 months Vitals Screenings to include cognitive, depression, and falls Referrals and appointments  In addition, I have reviewed and discussed with patient certain preventive protocols, quality metrics, and best practice recommendations. A written personalized care plan for preventive services as well as general preventive health recommendations were provided to patient.     Denman George [place], [place]   [id number]   Due to this being a virtual visit, the after visit summary with patients personalized plan was offered to patient via mail or my-chart. ***Patient declined at this time./ Patient would like to access on my-chart/ per request, patient was mailed a copy of AVS./ Patient preferred to pick up at office at next visit  Nurse Notes: ***

## 2022-05-29 NOTE — Patient Instructions (Incomplete)
[name] , Thank you for taking time to come for your Medicare Wellness Visit. I appreciate your ongoing commitment to your health goals. Please review the following plan we discussed and let me know if I can assist you in the future.   These are the goals we discussed:  Goals      Patient Stated     Starting 08/23/2018, I will continue to take medication as prescribed.     Patient Stated     11/08/2019, I will maintain and continue medications as prescribed.      Patient Stated     Would like maintain current routine        This is a list of the screening recommended for you and due dates:  Health Maintenance  Topic Date Due   DTaP/Tdap/Td vaccine (2 - Tdap) 12/29/2017   Zoster (Shingles) Vaccine (2 of 2) 11/15/2021   COVID-19 Vaccine (3 - 2023-24 season) 12/06/2021   Mammogram  04/30/2022   Medicare Annual Wellness Visit  05/29/2022   Pneumonia Vaccine  Completed   Flu Shot  Completed   DEXA scan (bone density measurement)  Completed   HPV Vaccine  Aged Out    Advanced directives: We have a copy of your advanced directives available in your record should your provider ever need to access them.  Conditions/risks identified: Aim for 30 minutes of exercise or brisk walking, 6-8 glasses of water, and 5 servings of fruits and vegetables each day.   Next appointment: Follow up in one year for your annual wellness visit    Preventive Care 65 Years and Older, Female Preventive care refers to lifestyle choices and visits with your health care provider that can promote health and wellness. What does preventive care include? A yearly physical exam. This is also called an annual well check. Dental exams once or twice a year. Routine eye exams. Ask your health care provider how often you should have your eyes checked. Personal lifestyle choices, including: Daily care of your teeth and gums. Regular physical activity. Eating a healthy diet. Avoiding tobacco and drug use. Limiting  alcohol use. Practicing safe sex. Taking low-dose aspirin every day. Taking vitamin and mineral supplements as recommended by your health care provider. What happens during an annual well check? The services and screenings done by your health care provider during your annual well check will depend on your age, overall health, lifestyle risk factors, and family history of disease. Counseling  Your health care provider may ask you questions about your: Alcohol use. Tobacco use. Drug use. Emotional well-being. Home and relationship well-being. Sexual activity. Eating habits. History of falls. Memory and ability to understand (cognition). Work and work Statistician. Reproductive health. Screening  You may have the following tests or measurements: Height, weight, and BMI. Blood pressure. Lipid and cholesterol levels. These may be checked every 5 years, or more frequently if you are over 72 years old. Skin check. Lung cancer screening. You may have this screening every year starting at age 36 if you have a 30-pack-year history of smoking and currently smoke or have quit within the past 15 years. Fecal occult blood test (FOBT) of the stool. You may have this test every year starting at age 51. Flexible sigmoidoscopy or colonoscopy. You may have a sigmoidoscopy every 5 years or a colonoscopy every 10 years starting at age 51. Hepatitis C blood test. Hepatitis B blood test. Sexually transmitted disease (STD) testing. Diabetes screening. This is done by checking your blood sugar (glucose)  after you have not eaten for a while (fasting). You may have this done every 1-3 years. Bone density scan. This is done to screen for osteoporosis. You may have this done starting at age 91. Mammogram. This may be done every 1-2 years. Talk to your health care provider about how often you should have regular mammograms. Talk with your health care provider about your test results, treatment options, and if  necessary, the need for more tests. Vaccines  Your health care provider may recommend certain vaccines, such as: Influenza vaccine. This is recommended every year. Tetanus, diphtheria, and acellular pertussis (Tdap, Td) vaccine. You may need a Td booster every 10 years. Zoster vaccine. You may need this after age 58. Pneumococcal 13-valent conjugate (PCV13) vaccine. One dose is recommended after age 57. Pneumococcal polysaccharide (PPSV23) vaccine. One dose is recommended after age 2. Talk to your health care provider about which screenings and vaccines you need and how often you need them. This information is not intended to replace advice given to you by your health care provider. Make sure you discuss any questions you have with your health care provider. Document Released: 04/20/2015 Document Revised: 12/12/2015 Document Reviewed: 01/23/2015 Elsevier Interactive Patient Education  2017 [employer] Prevention in the Home Falls can cause injuries. They can happen to people of all ages. There are many things you can do to make your home safe and to help prevent falls. What can I do on the outside of my home? Regularly fix the edges of walkways and driveways and fix any cracks. Remove anything that might make you trip as you walk through a door, such as a raised step or threshold. Trim any bushes or trees on the path to your home. Use bright outdoor lighting. Clear any walking paths of anything that might make someone trip, such as rocks or tools. Regularly check to see if handrails are loose or broken. Make sure that both sides of any steps have handrails. Any raised decks and porches should have guardrails on the edges. Have any leaves, snow, or ice cleared regularly. Use sand or salt on walking paths during winter. Clean up any spills in your garage right away. This includes oil or grease spills. What can I do in the bathroom? Use night lights. Install grab bars by the toilet  and in the tub and shower. Do not use towel bars as grab bars. Use non-skid mats or decals in the tub or shower. If you need to sit down in the shower, use a plastic, non-slip stool. Keep the floor dry. Clean up any water that spills on the floor as soon as it happens. Remove soap buildup in the tub or shower regularly. Attach bath mats securely with double-sided non-slip rug tape. Do not have throw rugs and other things on the floor that can make you trip. What can I do in the bedroom? Use night lights. Make sure that you have a light by your bed that is easy to reach. Do not use any sheets or blankets that are too big for your bed. They should not hang down onto the floor. Have a firm chair that has side arms. You can use this for support while you get dressed. Do not have throw rugs and other things on the floor that can make you trip. What can I do in the kitchen? Clean up any spills right away. Avoid walking on wet floors. Keep items that you use a lot in easy-to-reach places. If  you need to reach something above you, use a strong step stool that has a grab bar. Keep electrical cords out of the way. Do not use floor polish or wax that makes floors slippery. If you must use wax, use non-skid floor wax. Do not have throw rugs and other things on the floor that can make you trip. What can I do with my stairs? Do not leave any items on the stairs. Make sure that there are handrails on both sides of the stairs and use them. Fix handrails that are broken or loose. Make sure that handrails are as long as the stairways. Check any carpeting to make sure that it is firmly attached to the stairs. Fix any carpet that is loose or worn. Avoid having throw rugs at the top or bottom of the stairs. If you do have throw rugs, attach them to the floor with carpet tape. Make sure that you have a light switch at the top of the stairs and the bottom of the stairs. If you do not have them, ask someone to add  them for you. What else can I do to help prevent falls? Wear shoes that: Do not have high heels. Have rubber bottoms. Are comfortable and fit you well. Are closed at the toe. Do not wear sandals. If you use a stepladder: Make sure that it is fully opened. Do not climb a closed stepladder. Make sure that both sides of the stepladder are locked into place. Ask someone to hold it for you, if possible. Clearly mark and make sure that you can see: Any grab bars or handrails. First and last steps. Where the edge of each step is. Use tools that help you move around (mobility aids) if they are needed. These include: Canes. Walkers. Scooters. Crutches. Turn on the lights when you go into a dark area. Replace any light bulbs as soon as they burn out. Set up your furniture so you have a clear path. Avoid moving your furniture around. If any of your floors are uneven, fix them. If there are any pets around you, be aware of where they are. Review your medicines with your doctor. Some medicines can make you feel dizzy. This can increase your chance of falling. Ask your doctor what other things that you can do to help prevent falls. This information is not intended to replace advice given to you by your health care provider. Make sure you discuss any questions you have with your health care provider. Document Released: 01/18/2009 Document Revised: 08/30/2015 Document Reviewed: 04/28/2014 Elsevier Interactive Patient Education  2017 [employer].

## 2022-06-05 ENCOUNTER — Ambulatory Visit
Admission: RE | Admit: 2022-06-05 | Discharge: 2022-06-05 | Disposition: A | Discharge status: Home / Self Care | Payer: Medicare HMO | Source: Ambulatory Visit | Attending: Family Medicine | Admitting: Family Medicine

## 2022-06-05 DIAGNOSIS — Z1231 Encounter for screening mammogram for malignant neoplasm of breast: Secondary | ICD-10-CM

## 2022-06-23 ENCOUNTER — Ambulatory Visit (INDEPENDENT_AMBULATORY_CARE_PROVIDER_SITE_OTHER): Payer: Medicare HMO | Admitting: Family

## 2022-06-23 ENCOUNTER — Encounter: Payer: Self-pay | Admitting: Family

## 2022-06-23 VITALS — BP 118/64 | HR 86 | Temp 97.7°F | Ht 62.0 in | Wt 150.8 lb

## 2022-06-23 DIAGNOSIS — J020 Streptococcal pharyngitis: Secondary | ICD-10-CM | POA: Insufficient documentation

## 2022-06-23 DIAGNOSIS — J029 Acute pharyngitis, unspecified: Secondary | ICD-10-CM

## 2022-06-23 LAB — POCT RAPID STREP A (OFFICE): Rapid Strep A Screen: POSITIVE — AB

## 2022-06-23 MED ORDER — AMOXICILLIN 875 MG PO TABS: 875.0000 mg | ORAL_TABLET | Freq: Two times a day (BID) | ORAL | 0 refills | Status: AC

## 2022-06-23 NOTE — Assessment & Plan Note (Signed)
Strep tested positive in office.  rx for high dose amox 875 mg po bid x 10 days as pt also with uri sx  Ibuprofen/tyelnol prn sore throat/fever Pt told to F/u if no improvement in the next 2-3 days.

## 2022-06-23 NOTE — Progress Notes (Signed)
Established Patient Office Visit  Subjective:   Patient ID: Valerie Ho, female    DOB: 08-06-1940  Age: 82 y.o. MRN: [id number]  CC:  Chief Complaint  Patient presents with   Cough    HPI: Valerie Ho is a 82 y.o. female presenting on 06/23/2022 for Cough  Cough    Symptoms started after mowing last week about four days ago.  Started with scratchy throat, productive cough, slight chest congestion and slight wheeze.  No headache. Denies nasal congestion and also sinus pressure.   She thought initially was allergy but feels this may have since progressed to bronchitis.        ROS: Negative unless specifically indicated above in HPI.   Relevant past medical history reviewed and updated as indicated.   Allergies and medications reviewed and updated.   Current Outpatient Medications:    amoxicillin (AMOXIL) 875 MG tablet, Take 1 tablet (875 mg total) by mouth 2 (two) times daily for 10 days., Disp: 20 tablet, Rfl: 0   AREXVY 120 MCG/0.5ML injection, , Disp: , Rfl:    Calcium Carbonate-Vitamin D (CALCIUM-VITAMIN D) 600-200 MG-UNIT CAPS, Take 1 tablet by mouth 2 (two) times daily.  , Disp: , Rfl:    Cholecalciferol (VITAMIN D) 50 MCG (2000 UT) tablet, Take 2,000 Units by mouth daily., Disp: , Rfl:    Coenzyme Q10 (COQ-10) 200 MG CAPS, Take 1 capsule by mouth daily., Disp: , Rfl:    COMIRNATY SUSP injection, , Disp: , Rfl:    FLUZONE HIGH-DOSE QUADRIVALENT 0.7 ML SUSY, , Disp: , Rfl:    Ibuprofen 200 MG CAPS, Take 1-2 capsules by mouth at bedtime as needed., Disp: , Rfl:    levothyroxine (SYNTHROID) 75 MCG tablet, Take 1 tablet by mouth once daily, Disp: 90 tablet, Rfl: 3   mirabegron ER (MYRBETRIQ) 50 MG TB24 tablet, Take 1 tablet (50 mg total) by mouth daily., Disp: 30 tablet, Rfl: 11   Omega-3 Fatty Acids (FISH OIL) 1000 MG CAPS, Take 1,000 capsules by mouth daily., Disp: , Rfl:   Allergies  Allergen Reactions   Oxycodone Other (See Comments)    vomiting    Propoxyphene N-Acetaminophen     REACTION: Nausea and Vomiting    Objective:   BP 118/64   Pulse 86   Temp 97.7 F (36.5 C) (Temporal)   Ht 5\' 2"  (1.575 m)   Wt 150 lb 12.8 oz (68.4 kg)   SpO2 98%   BMI 27.58 kg/m    Physical Exam Constitutional:      General: She is not in acute distress.    Appearance: Normal appearance. She is normal weight. She is not ill-appearing, toxic-appearing or diaphoretic.  HENT:     Head: Normocephalic.     Right Ear: Tympanic membrane normal.     Left Ear: Tympanic membrane normal.     Nose: Nose normal.     Mouth/Throat:     Mouth: Mucous membranes are dry.     Pharynx: Posterior oropharyngeal erythema present. No oropharyngeal exudate.  Eyes:     Extraocular Movements: Extraocular movements intact.     Pupils: Pupils are equal, round, and reactive to light.  Cardiovascular:     Rate and Rhythm: Normal rate and regular rhythm.     Pulses: Normal pulses.     Heart sounds: Normal heart sounds.  Pulmonary:     Effort: Pulmonary effort is normal.     Breath sounds: Normal breath sounds.  Musculoskeletal:  Cervical back: Normal range of motion.  Neurological:     General: No focal deficit present.     Mental Status: She is alert and oriented to person, place, and time. Mental status is at baseline.  Psychiatric:        Mood and Affect: Mood normal.        Behavior: Behavior normal.        Thought Content: Thought content normal.        Judgment: Judgment normal.     Assessment & Plan:  Sore throat -     POCT rapid strep A  Strep pharyngitis Assessment & Plan: Strep tested positive in office.  rx for high dose amox 875 mg po bid x 10 days as pt also with uri sx  Ibuprofen/tyelnol prn sore throat/fever Pt told to F/u if no improvement in the next 2-3 days.   Orders: -     Amoxicillin; Take 1 tablet (875 mg total) by mouth 2 (two) times daily for 10 days.  Dispense: 20 tablet; Refill: 0     Follow up plan: No follow-ups on  file.  Valerie Pancoast, FNP

## 2022-06-23 NOTE — Patient Instructions (Addendum)
Strep tested positive in office.  rx for amox 500 mg po bid x 10 days.  Ibuprofen/tyelnol prn sore throat/fever Pt told to F/u if no improvement in the next 2-3 days.   Regards,   Eugenia Pancoast FNP-C

## 2022-07-01 ENCOUNTER — Ambulatory Visit (INDEPENDENT_AMBULATORY_CARE_PROVIDER_SITE_OTHER): Payer: Medicare HMO

## 2022-07-01 ENCOUNTER — Telehealth: Payer: Self-pay | Admitting: Family Medicine

## 2022-07-01 VITALS — Ht 63.0 in | Wt 150.0 lb

## 2022-07-01 DIAGNOSIS — Z0000 Encounter for general adult medical examination without abnormal findings: Secondary | ICD-10-CM | POA: Diagnosis not present

## 2022-07-01 NOTE — Progress Notes (Signed)
I connected with  [name] on 07/01/22 by a audio enabled telemedicine application and verified that I am speaking with the correct person using two identifiers.  Patient Location: Home  Provider Location: Office/Clinic  I discussed the limitations of evaluation and management by telemedicine. The patient expressed understanding and agreed to proceed.  Subjective:   Valerie Ho is a 82 y.o. female who presents for Medicare Annual (Subsequent) preventive examination.  Review of Systems      Cardiac Risk Factors include: advanced age (>37men, >42 women);sedentary lifestyle     Objective:    Today's Vitals   07/01/22 1438  Weight: 150 lb (68 kg)  Height: 5\' 3"  (1.6 m)   Body mass index is 26.57 kg/m.     07/01/2022    2:48 PM 05/29/2021    8:25 AM 11/08/2019    3:32 PM 08/23/2018   10:32 AM 08/12/2017    9:26 AM 08/08/2016    9:17 AM  Advanced Directives  Does Patient Have a Medical Advance Directive? Yes Yes Yes Yes Yes No  Type of Paramedic of [place];Living will [place];Living will [place];Living will [place];Living will [place];Living will   Does patient want to make changes to medical advance directive? No - Patient declined Yes (MAU/Ambulatory/Procedural Areas - Information given)      Copy of [place] in Chart? Yes - validated most recent copy scanned in chart (See row information) Yes - validated most recent copy scanned in chart (See row information) Yes - validated most recent copy scanned in chart (See row information) Yes - validated most recent copy scanned in chart (See row information) No - copy requested     Current Medications (verified) Outpatient Encounter Medications as of 07/01/2022  Medication Sig   amoxicillin (AMOXIL) 875 MG tablet Take 1 tablet (875 mg total) by mouth 2 (two) times daily for 10 days.   Calcium  Carbonate-Vitamin D (CALCIUM-VITAMIN D) 600-200 MG-UNIT CAPS Take 1 tablet by mouth 2 (two) times daily.     Cholecalciferol (VITAMIN D) 50 MCG (2000 UT) tablet Take 2,000 Units by mouth daily.   Coenzyme Q10 (COQ-10) 200 MG CAPS Take 1 capsule by mouth daily.   COMIRNATY SUSP injection    Ibuprofen 200 MG CAPS Take 1-2 capsules by mouth at bedtime as needed.   levothyroxine (SYNTHROID) 75 MCG tablet Take 1 tablet by mouth once daily   mirabegron ER (MYRBETRIQ) 50 MG TB24 tablet Take 1 tablet (50 mg total) by mouth daily.   Omega-3 Fatty Acids (FISH OIL) 1000 MG CAPS Take 1,000 capsules by mouth daily.   AREXVY 120 MCG/0.5ML injection  (Patient not taking: Reported on 07/01/2022)   FLUZONE HIGH-DOSE QUADRIVALENT 0.7 ML SUSY  (Patient not taking: Reported on 07/01/2022)   No facility-administered encounter medications on file as of 07/01/2022.    Allergies (verified) Oxycodone and Propoxyphene n-acetaminophen   History: Past Medical History:  Diagnosis Date   BCC (basal cell carcinoma of skin)    Cataract    Compression fracture of L2 ([place]) 2014   after a fall    Hyperlipidemia    Hypothyroid    Lactose intolerance    Osteopenia    Past Surgical History:  Procedure Laterality Date   BACK SURGERY  1988   lumbar disc surgery   Family History  Problem Relation Age of Onset   Heart disease Mother  chf   Cancer Paternal Uncle    Diabetes Maternal Grandfather    Colon cancer Neg Hx    Breast cancer Neg Hx    Social History   Socioeconomic History   Marital status: Married    Spouse name: Not on file   Number of children: Not on file   Years of education: Not on file   Highest education level: Not on file  Occupational History   Not on file  Tobacco Use   Smoking status: Never   Smokeless tobacco: Never  Vaping Use   Vaping Use: Never used  Substance and Sexual Activity   Alcohol use: No    Alcohol/week: 0.0 standard drinks of alcohol   Drug use: No   Sexual  activity: Not Currently  Other Topics Concern   Not on file  Social History Narrative   From [place]   Retired from [employer], [employer]   Enjoys knitting, reading, crosswords   Married 1961   Social Determinants of Health   Financial Resource Strain: [place]  (07/01/2022)   Overall Financial Resource Strain (CARDIA)    Difficulty of Paying Living Expenses: Not hard at all  Food Insecurity: No Food Insecurity (07/01/2022)   Hunger Vital Sign    Worried About Running Out of Food in the Last Year: Never true    [place] in the Last Year: Never true  Transportation Needs: No Transportation Needs (07/01/2022)   PRAPARE - Hydrologist (Medical): No    Lack of Transportation (Non-Medical): No  Physical Activity: Inactive (07/01/2022)   Exercise Vital Sign    Days of Exercise per Week: 0 days    Minutes of Exercise per Session: 0 min  Stress: No Stress Concern Present (07/01/2022)   [place]    Feeling of Stress : Not at all  Social Connections: [place] (07/01/2022)   Social Connection and Isolation Panel [NHANES]    Frequency of Communication with Friends and Family: More than three times a week    Frequency of Social Gatherings with Friends and Family: More than three times a week    Attends Religious Services: More than 4 times per year    Active Member of [employer] or Organizations: Yes    Attends Music therapist: More than 4 times per year    Marital Status: Married    Tobacco Counseling Counseling given: Not Answered   Clinical Intake:  Pre-visit preparation completed: Yes  Pain : No/denies pain     Nutritional Risks: None Diabetes: No  How often do you need to have someone help you when you read instructions, pamphlets, or other written materials from your doctor or pharmacy?: 1 - Never  Diabetic? no  Interpreter Needed?:  No  Information entered by :: C.Maytte Jacot LPN   Activities of Daily Living    07/01/2022    2:48 PM  In your present state of health, do you have any difficulty performing the following activities:  Hearing? 0  Vision? 0  Difficulty concentrating or making decisions? 0  Walking or climbing stairs? 0  Dressing or bathing? 0  Doing errands, shopping? 0  Preparing Food and eating ? N  Using the Toilet? N  In the past six months, have you accidently leaked urine? Y  Comment Followed by Dr.Schroeder  Do you have problems with loss of bowel control? N  Managing your Medications? N  Managing your  Finances? N  Housekeeping or managing your Housekeeping? N    Patient Care Team: Tonia Ghent, MD as PCP - General (Family Medicine) Katy Apo, MD as Consulting Physician (Ophthalmology)  Indicate any recent Medical Services you may have received from other than [facility] providers in the past year (date may be approximate).     Assessment:   This is a routine wellness examination for Christe.  Hearing/Vision screen Hearing Screening - Comments:: aids Vision Screening - Comments:: Readers - [facility]  Dietary issues and exercise activities discussed: Current Exercise Habits: The patient does not participate in regular exercise at present, Exercise limited by: None identified   Goals Addressed             This Visit's Progress    Patient Stated       Stay active.       Depression Screen    07/01/2022    2:47 PM 06/23/2022   10:35 AM 05/29/2021    8:26 AM 12/31/2020   12:20 PM 11/08/2019    3:36 PM 08/23/2018   10:32 AM 08/19/2018   10:05 AM  PHQ 2/9 Scores  PHQ - 2 Score 0 0 0 0 0 0 0  PHQ- 9 Score     0 0 0    Fall Risk    07/01/2022    2:48 PM 06/23/2022   10:35 AM 05/29/2021    8:26 AM 12/31/2020   12:20 PM 11/08/2019    3:35 PM  [place] in the past year? 0 0 0 0 0  Number falls in past yr: 0 0 0 0 0  Injury with Fall? 0 0 0 0 0  Risk for  fall due to : No Fall Risks  No Fall Risks No Fall Risks No Fall Risks  Follow up Falls prevention discussed;Falls evaluation completed Falls evaluation completed;Education provided;Falls prevention discussed Falls prevention discussed Falls evaluation completed Falls evaluation completed;Falls prevention discussed    FALL RISK PREVENTION PERTAINING TO THE HOME:  Any stairs in or around the home? Yes  If so, are there any without handrails? No  Home free of loose throw rugs in walkways, pet beds, electrical cords, etc? Yes  Adequate lighting in your home to reduce risk of falls? Yes   ASSISTIVE DEVICES UTILIZED TO PREVENT FALLS:  Life alert? No  Use of a cane, walker or w/c? No  Grab bars in the bathroom? No  Shower chair or bench in shower? Yes  Elevated toilet seat or a handicapped toilet? Yes    Cognitive Function:    11/08/2019    3:37 PM 08/23/2018   10:32 AM 08/19/2018   10:05 AM 08/12/2017    9:26 AM 08/08/2016    9:18 AM  MMSE - Mini Mental State Exam  Orientation to time 5 5 5 5 5   Orientation to Place 5 5 5 5 5   Registration 3 3 3 3 3   Attention/ Calculation 5 0 0 0 0  Recall 3 3 3 3 3   Language- name 2 objects  0 0 0 0  Language- repeat 1 1 1 1 1   Language- follow 3 step command  0 0 3 3  Language- read & follow direction  0 0 0 0  Write a sentence  0 0 0 0  Copy design  0 0 0 0  Total score  17 17 20 20         07/01/2022    2:50 PM  6CIT Screen  What Year? 0 points  What month? 0 points  What time? 0 points  Count back from 20 0 points  Months in reverse 0 points  Repeat phrase 0 points  Total Score 0 points    Immunizations Immunization History  Administered Date(s) Administered   Fluad Quad(high Dose 65+) 01/31/2022   Influenza Split 01/20/2012   Influenza, High Dose Seasonal PF 02/06/2017   Influenza,inj,Quad PF,6+ Mos 03/08/2013, 03/14/2016   PFIZER(Purple Top)SARS-COV-2 Vaccination 05/02/2019, 05/23/2019   Pneumococcal Conjugate-13 07/25/2014    Pneumococcal Polysaccharide-23 01/20/2012   Td 12/30/2007   Zoster Recombinat (Shingrix) 09/20/2021    TDAP status: Due, Education has been provided regarding the importance of this vaccine. Advised may receive this vaccine at local pharmacy or Health Dept. Aware to provide a copy of the vaccination record if obtained from local pharmacy or Health Dept. Verbalized acceptance and understanding.  Flu Vaccine status: Up to date  Pneumococcal vaccine status: Up to date  Covid-19 vaccine status: Completed vaccines  Qualifies for Shingles Vaccine? Yes   Zostavax completed Yes   Shingrix Completed?: Yes  Screening Tests Health Maintenance  Topic Date Due   DTaP/Tdap/Td (2 - Tdap) 12/29/2017   COVID-19 Vaccine (3 - 2023-24 season) 07/09/2022 (Originally 12/06/2021)   Zoster Vaccines- Shingrix (2 of 2) 09/23/2022 (Originally 11/15/2021)   MAMMOGRAM  06/05/2023   Medicare Annual Wellness (AWV)  07/01/2023   Pneumonia Vaccine 38+ Years old  Completed   INFLUENZA VACCINE  Completed   DEXA SCAN  Completed   HPV [place] Maintenance Due  Topic Date Due   DTaP/Tdap/Td (2 - Tdap) 12/29/2017    Colorectal cancer screening: No longer required.   Mammogram status: Completed 028/29/24. Repeat every year  Bone Scan - Declined  Lung Cancer Screening: (Low Dose CT Chest recommended if Age 15-80 years, 30 pack-year currently smoking OR have quit w/in 15years.) does not qualify.   Lung Cancer Screening Referral: no  Additional Screening:  Hepatitis C Screening: does not qualify; Completed no  Vision Screening: Recommended annual ophthalmology exams for early detection of glaucoma and other disorders of the eye. Is the patient up to date with their annual eye exam?  Yes , has appointment scheduled. Who is the provider or what is the name of the office in which the patient attends annual eye exams? [facility] If pt is not established with  a provider, would they like to be referred to a provider to establish care? No .   Dental Screening: Recommended annual dental exams for proper oral hygiene  Community Resource Referral / Chronic Care Management: CRR required this visit?  No   CCM required this visit?  No      Plan:     I have personally reviewed and noted the following in the patient's chart:   Medical and social history Use of alcohol, tobacco or illicit drugs  Current medications and supplements including opioid prescriptions. Patient is not currently taking opioid prescriptions. Functional ability and status Nutritional status Physical activity Advanced directives List of other physicians Hospitalizations, surgeries, and ER visits in previous 12 months Vitals Screenings to include cognitive, depression, and falls Referrals and appointments  In addition, I have reviewed and discussed with patient certain preventive protocols, quality metrics, and best practice recommendations. A written personalized care plan for preventive services as well as general preventive health recommendations were provided to patient.     Lebron Conners, LPN   [id number]  Nurse Notes: Declines bone scan.

## 2022-07-01 NOTE — Patient Instructions (Signed)
Valerie Ho , Thank you for taking time to come for your Medicare Wellness Visit. I appreciate your ongoing commitment to your health goals. Please review the following plan we discussed and let me know if I can assist you in the future.   These are the goals we discussed:  Goals      Patient Stated     Starting 08/23/2018, I will continue to take medication as prescribed.     Patient Stated     11/08/2019, I will maintain and continue medications as prescribed.      Patient Stated     Would like maintain current routine     Patient Stated     Stay active.        This is a list of the screening recommended for you and due dates:  Health Maintenance  Topic Date Due   DTaP/Tdap/Td vaccine (2 - Tdap) 12/29/2017   COVID-19 Vaccine (3 - 2023-24 season) 07/09/2022*   Zoster (Shingles) Vaccine (2 of 2) 09/23/2022*   Mammogram  06/05/2023   Medicare Annual Wellness Visit  07/01/2023   Pneumonia Vaccine  Completed   Flu Shot  Completed   DEXA scan (bone density measurement)  Completed   HPV Vaccine  Aged Out  *Topic was postponed. The date shown is not the original due date.    Advanced directives: copy in chart  Conditions/risks identified: Aim for 30 minutes of exercise or brisk walking, 6-8 glasses of water, and 5 servings of fruits and vegetables each day.   Next appointment: Follow up in one year for your annual wellness visit 07/06/23 @ 10:30 telephone visit.   Preventive Care 82 Years and Older, Female Preventive care refers to lifestyle choices and visits with your health care provider that can promote health and wellness. What does preventive care include? A yearly physical exam. This is also called an annual well check. Dental exams once or twice a year. Routine eye exams. Ask your health care provider how often you should have your eyes checked. Personal lifestyle choices, including: Daily care of your teeth and gums. Regular physical activity. Eating a healthy  diet. Avoiding tobacco and drug use. Limiting alcohol use. Practicing safe sex. Taking low-dose aspirin every day. Taking vitamin and mineral supplements as recommended by your health care provider. What happens during an annual well check? The services and screenings done by your health care provider during your annual well check will depend on your age, overall health, lifestyle risk factors, and family history of disease. Counseling  Your health care provider may ask you questions about your: Alcohol use. Tobacco use. Drug use. Emotional well-being. Home and relationship well-being. Sexual activity. Eating habits. History of falls. Memory and ability to understand (cognition). Work and work Statistician. Reproductive health. Screening  You may have the following tests or measurements: Height, weight, and BMI. Blood pressure. Lipid and cholesterol levels. These may be checked every 5 years, or more frequently if you are over 16 years old. Skin check. Lung cancer screening. You may have this screening every year starting at age 72 if you have a 30-pack-year history of smoking and currently smoke or have quit within the past 15 years. Fecal occult blood test (FOBT) of the stool. You may have this test every year starting at age 28. Flexible sigmoidoscopy or colonoscopy. You may have a sigmoidoscopy every 5 years or a colonoscopy every 10 years starting at age 73. Hepatitis C blood test. Hepatitis B blood test. Sexually transmitted disease (STD)  testing. Diabetes screening. This is done by checking your blood sugar (glucose) after you have not eaten for a while (fasting). You may have this done every 1-3 years. Bone density scan. This is done to screen for osteoporosis. You may have this done starting at age 13. Mammogram. This may be done every 1-2 years. Talk to your health care provider about how often you should have regular mammograms. Talk with your health care provider about  your test results, treatment options, and if necessary, the need for more tests. Vaccines  Your health care provider may recommend certain vaccines, such as: Influenza vaccine. This is recommended every year. Tetanus, diphtheria, and acellular pertussis (Tdap, Td) vaccine. You may need a Td booster every 10 years. Zoster vaccine. You may need this after age 65. Pneumococcal 13-valent conjugate (PCV13) vaccine. One dose is recommended after age 52. Pneumococcal polysaccharide (PPSV23) vaccine. One dose is recommended after age 69. Talk to your health care provider about which screenings and vaccines you need and how often you need them. This information is not intended to replace advice given to you by your health care provider. Make sure you discuss any questions you have with your health care provider. Document Released: 04/20/2015 Document Revised: 12/12/2015 Document Reviewed: 01/23/2015 Elsevier Interactive Patient Education  2017 [place] Prevention in the Home Falls can cause injuries. They can happen to people of all ages. There are many things you can do to make your home safe and to help prevent falls. What can I do on the outside of my home? Regularly fix the edges of walkways and driveways and fix any cracks. Remove anything that might make you trip as you walk through a door, such as a raised step or threshold. Trim any bushes or trees on the path to your home. Use bright outdoor lighting. Clear any walking paths of anything that might make someone trip, such as rocks or tools. Regularly check to see if handrails are loose or broken. Make sure that both sides of any steps have handrails. Any raised decks and porches should have guardrails on the edges. Have any leaves, snow, or ice cleared regularly. Use sand or salt on walking paths during winter. Clean up any spills in your garage right away. This includes oil or grease spills. What can I do in the bathroom? Use  night lights. Install grab bars by the toilet and in the tub and shower. Do not use towel bars as grab bars. Use non-skid mats or decals in the tub or shower. If you need to sit down in the shower, use a plastic, non-slip stool. Keep the floor dry. Clean up any water that spills on the floor as soon as it happens. Remove soap buildup in the tub or shower regularly. Attach bath mats securely with double-sided non-slip rug tape. Do not have throw rugs and other things on the floor that can make you trip. What can I do in the bedroom? Use night lights. Make sure that you have a light by your bed that is easy to reach. Do not use any sheets or blankets that are too big for your bed. They should not hang down onto the floor. Have a firm chair that has side arms. You can use this for support while you get dressed. Do not have throw rugs and other things on the floor that can make you trip. What can I do in the kitchen? Clean up any spills right away. Avoid walking on wet  floors. Keep items that you use a lot in easy-to-reach places. If you need to reach something above you, use a strong step stool that has a grab bar. Keep electrical cords out of the way. Do not use floor polish or wax that makes floors slippery. If you must use wax, use non-skid floor wax. Do not have throw rugs and other things on the floor that can make you trip. What can I do with my stairs? Do not leave any items on the stairs. Make sure that there are handrails on both sides of the stairs and use them. Fix handrails that are broken or loose. Make sure that handrails are as long as the stairways. Check any carpeting to make sure that it is firmly attached to the stairs. Fix any carpet that is loose or worn. Avoid having throw rugs at the top or bottom of the stairs. If you do have throw rugs, attach them to the floor with carpet tape. Make sure that you have a light switch at the top of the stairs and the bottom of the  stairs. If you do not have them, ask someone to add them for you. What else can I do to help prevent falls? Wear shoes that: Do not have high heels. Have rubber bottoms. Are comfortable and fit you well. Are closed at the toe. Do not wear sandals. If you use a stepladder: Make sure that it is fully opened. Do not climb a closed stepladder. Make sure that both sides of the stepladder are locked into place. Ask someone to hold it for you, if possible. Clearly mark and make sure that you can see: Any grab bars or handrails. First and last steps. Where the edge of each step is. Use tools that help you move around (mobility aids) if they are needed. These include: Canes. Walkers. Scooters. Crutches. Turn on the lights when you go into a dark area. Replace any light bulbs as soon as they burn out. Set up your furniture so you have a clear path. Avoid moving your furniture around. If any of your floors are uneven, fix them. If there are any pets around you, be aware of where they are. Review your medicines with your doctor. Some medicines can make you feel dizzy. This can increase your chance of falling. Ask your doctor what other things that you can do to help prevent falls. This information is not intended to replace advice given to you by your health care provider. Make sure you discuss any questions you have with your health care provider. Document Released: 01/18/2009 Document Revised: 08/30/2015 Document Reviewed: 04/28/2014 Elsevier Interactive Patient Education  2017 [employer].

## 2022-07-01 NOTE — Telephone Encounter (Signed)
Called patient to schedule Medicare Annual Wellness Visit (AWV). Left message for patient to call back and schedule Medicare Annual Wellness Visit (AWV).  Last date of AWV: 05/29/2021  Please schedule an appointment at any time with [facility].  If any questions, please contact me at [phone].  Thank you ,  [place] Direct Dial: [phone]

## 2022-09-11 DIAGNOSIS — H2513 Age-related nuclear cataract, bilateral: Secondary | ICD-10-CM | POA: Diagnosis not present

## 2022-09-11 DIAGNOSIS — H52203 Unspecified astigmatism, bilateral: Secondary | ICD-10-CM | POA: Diagnosis not present

## 2022-09-11 DIAGNOSIS — H25013 Cortical age-related cataract, bilateral: Secondary | ICD-10-CM | POA: Diagnosis not present

## 2022-09-11 DIAGNOSIS — H524 Presbyopia: Secondary | ICD-10-CM | POA: Diagnosis not present

## 2022-12-23 ENCOUNTER — Ambulatory Visit: Payer: Medicare HMO | Admitting: Dermatology

## 2023-01-22 ENCOUNTER — Ambulatory Visit: Payer: Medicare HMO | Admitting: Obstetrics and Gynecology

## 2023-01-23 ENCOUNTER — Encounter: Payer: Self-pay | Admitting: Obstetrics and Gynecology

## 2023-01-23 ENCOUNTER — Ambulatory Visit: Payer: Medicare HMO | Admitting: Obstetrics and Gynecology

## 2023-01-23 DIAGNOSIS — N3281 Overactive bladder: Secondary | ICD-10-CM

## 2023-01-23 MED ORDER — MIRABEGRON ER 50 MG PO TB24: 50.0000 mg | ORAL_TABLET | Freq: Every day | ORAL | 11 refills | Status: DC

## 2023-01-23 NOTE — Progress Notes (Signed)
[facility] Return Visit  SUBJECTIVE  History of Present Illness: Valerie Ho is a 82 y.o. female seen in follow-up for OAB and fecal incontinence. She is on Myrbetriq 50 mg and has been taking metamucil.   Mybetriq has been working very well for her. No longer wears pads. Pays $47/ month but after the first of the year will need to pay 20% of the cost and is not sure she will be able to afford it.    Has not any bowel leakage with metamucil.   No new updates to her medical history.   Past Medical History: Patient  has a past medical history of BCC (basal cell carcinoma of skin), Cataract, Compression fracture of L2 (HCC) (2014), Hyperlipidemia, Hypothyroid, Lactose intolerance, and Osteopenia.   Past Surgical History: She  has a past surgical history that includes Back surgery (1988).   Medications: She has a current medication list which includes the following prescription(s): arexvy, calcium-vitamin d, vitamin d, coq-10, comirnaty, fluzone high-dose quadrivalent, ibuprofen, levothyroxine, fish oil, and mirabegron er.   Allergies: Patient is allergic to oxycodone and propoxyphene n-acetaminophen.   Social History: Patient  reports that she has never smoked. She has never used smokeless tobacco. She reports that she does not drink alcohol and does not use drugs.      OBJECTIVE     Physical Exam: Vitals:   01/23/23 1146  BP: (!) 93/58  Pulse: 73     Gen: No apparent distress, A&O x 3.  Detailed Urogynecologic Evaluation:  Deferred.    ASSESSMENT AND PLAN    Valerie Ho is a 82 y.o. with:  1. Overactive bladder     - Continue with Myrbetriq 50mg , refill provided for one year.  - We discussed the option of Gemtesa- this may be an alternative for her if the cost of Myrbetriq will become too high. Samples provided to her of Gemtesa. She will try and call her insurance regarding coverage. If this works for her an is less expensive, then can switch the  prescription.  - Continue with metamucil.   Return 1 year or sooner if needed  Marguerita Beards, MD   Time spent: I spent 20 minutes dedicated to the care of this patient on the date of this encounter to include pre-visit review of records, face-to-face time with the patient and post visit documentation.

## 2023-02-12 ENCOUNTER — Ambulatory Visit: Payer: Medicare HMO | Admitting: Internal Medicine

## 2023-02-18 ENCOUNTER — Other Ambulatory Visit: Payer: Self-pay | Admitting: Family Medicine

## 2023-02-18 NOTE — Telephone Encounter (Signed)
Patient is due for annual exam soon please schedule and route back to me when scheduled. Thank you.

## 2023-02-18 NOTE — Telephone Encounter (Signed)
Patient has been scheduled

## 2023-02-22 ENCOUNTER — Other Ambulatory Visit: Payer: Self-pay | Admitting: Family Medicine

## 2023-02-22 DIAGNOSIS — E039 Hypothyroidism, unspecified: Secondary | ICD-10-CM

## 2023-02-22 DIAGNOSIS — M8580 Other specified disorders of bone density and structure, unspecified site: Secondary | ICD-10-CM

## 2023-02-22 DIAGNOSIS — E785 Hyperlipidemia, unspecified: Secondary | ICD-10-CM

## 2023-02-23 ENCOUNTER — Other Ambulatory Visit: Payer: Medicare HMO

## 2023-02-23 DIAGNOSIS — E785 Hyperlipidemia, unspecified: Secondary | ICD-10-CM | POA: Diagnosis not present

## 2023-02-23 DIAGNOSIS — M8580 Other specified disorders of bone density and structure, unspecified site: Secondary | ICD-10-CM | POA: Diagnosis not present

## 2023-02-23 DIAGNOSIS — E039 Hypothyroidism, unspecified: Secondary | ICD-10-CM

## 2023-02-23 LAB — COMPREHENSIVE METABOLIC PANEL
ALT: 11 U/L (ref 0–35)
AST: 17 U/L (ref 0–37)
Albumin: 4.1 g/dL (ref 3.5–5.2)
Alkaline Phosphatase: 73 U/L (ref 39–117)
BUN: 11 mg/dL (ref 6–23)
CO2: 27 meq/L (ref 19–32)
Calcium: 9 mg/dL (ref 8.4–10.5)
Chloride: 103 meq/L (ref 96–112)
Creatinine, Ser: 0.91 mg/dL (ref 0.40–1.20)
GFR: 58.78 mL/min — ABNORMAL LOW (ref >60.00)
Glucose, Bld: 86 mg/dL (ref 70–99)
Potassium: 4.3 meq/L (ref 3.5–5.1)
Sodium: 140 meq/L (ref 135–145)
Total Bilirubin: 0.6 mg/dL (ref 0.2–1.2)
Total Protein: 7.1 g/dL (ref 6.0–8.3)

## 2023-02-23 LAB — LIPID PANEL
Cholesterol: 221 mg/dL — ABNORMAL HIGH (ref 0–200)
HDL: 39 mg/dL — ABNORMAL LOW (ref >39.00)
LDL Cholesterol: 148 mg/dL — ABNORMAL HIGH (ref 0–99)
NonHDL: 181.57
Total CHOL/HDL Ratio: 6
Triglycerides: 166 mg/dL — ABNORMAL HIGH (ref 0.0–149.0)
VLDL: 33.2 mg/dL (ref 0.0–40.0)

## 2023-02-23 LAB — VITAMIN D 25 HYDROXY (VIT D DEFICIENCY, FRACTURES): VITD: 50.19 ng/mL (ref 30.00–100.00)

## 2023-02-23 LAB — TSH: TSH: 2.53 u[IU]/mL (ref 0.35–5.50)

## 2023-03-02 ENCOUNTER — Encounter: Payer: Self-pay | Admitting: Family Medicine

## 2023-03-02 ENCOUNTER — Ambulatory Visit: Payer: Medicare HMO | Admitting: Family Medicine

## 2023-03-02 VITALS — BP 102/70 | HR 87 | Temp 98.0°F | Ht 62.5 in | Wt 149.4 lb

## 2023-03-02 DIAGNOSIS — N393 Stress incontinence (female) (male): Secondary | ICD-10-CM

## 2023-03-02 DIAGNOSIS — Z7189 Other specified counseling: Secondary | ICD-10-CM

## 2023-03-02 DIAGNOSIS — L723 Sebaceous cyst: Secondary | ICD-10-CM | POA: Diagnosis not present

## 2023-03-02 DIAGNOSIS — Z0000 Encounter for general adult medical examination without abnormal findings: Secondary | ICD-10-CM

## 2023-03-02 DIAGNOSIS — E785 Hyperlipidemia, unspecified: Secondary | ICD-10-CM

## 2023-03-02 DIAGNOSIS — E039 Hypothyroidism, unspecified: Secondary | ICD-10-CM | POA: Diagnosis not present

## 2023-03-02 MED ORDER — LEVOTHYROXINE SODIUM 75 MCG PO TABS: 75.0000 ug | ORAL_TABLET | Freq: Every day | ORAL | 3 refills | Status: DC

## 2023-03-02 NOTE — Patient Instructions (Addendum)
If your husband needs a cardiology appointment in [place], then call [facility] at [facility] [address] [place],  [place]  [address] Main: [phone]  Take care.  Glad to see you.

## 2023-03-02 NOTE — Progress Notes (Unsigned)
Hypothyroidism.  Compliant with replacement.  No neck mass, no dysphagia.  TSH wnl.    Lipids higher than prev. D/w pt about diet and exercise.     She had seen urogyn and is on myrbetriq.  It helped in the meantime. No ADE on med.  She may end up changing to gemtesa.    She is going to f/u with dermatology.     Mammogram 2024 DXA declined.  She wouldn't want to go through treatment.   Tetanus 2009  Shingles prev PNA prev done.  RSV prev done.  covid to be done this fall.   Flu 2024 Advance directive- would have her husband then both of her kids designated if patient were incapacitated.   She had R hip pain after working in the yard and was pushing down hard with her R leg to leverage a heavy mower deck.  Sx resolved in the meantime.    She had a longstanding sebaceous cyst on the L side of her back.  Not ttp or red. Present for years.  D/w pt about options.    Meds, vitals, and allergies reviewed.   ROS: Per HPI unless specifically indicated in ROS section   GEN: nad, alert and oriented HEENT: ncat NECK: supple w/o LA CV: rrr PULM: ctab, no inc wob ABD: soft, +bs EXT: no edema SKIN: no acute rash

## 2023-03-04 DIAGNOSIS — Z7189 Other specified counseling: Secondary | ICD-10-CM | POA: Insufficient documentation

## 2023-03-04 DIAGNOSIS — Z0000 Encounter for general adult medical examination without abnormal findings: Secondary | ICD-10-CM | POA: Insufficient documentation

## 2023-03-04 DIAGNOSIS — L723 Sebaceous cyst: Secondary | ICD-10-CM | POA: Insufficient documentation

## 2023-03-04 NOTE — Assessment & Plan Note (Signed)
Continue work on diet and exercise.

## 2023-03-04 NOTE — Assessment & Plan Note (Signed)
Per urogynecology.

## 2023-03-04 NOTE — Assessment & Plan Note (Signed)
No intervention needed, present for years.  Benign-appearing.  Can perform I&D if it becomes inflamed.

## 2023-03-04 NOTE — Assessment & Plan Note (Signed)
Compliant with replacement.  No neck mass, no dysphagia.  TSH wnl.   Continue levothyroxine as is

## 2023-03-04 NOTE — Assessment & Plan Note (Signed)
Mammogram 2024 DXA declined.  She wouldn't want to go through treatment.  Tetanus 2009  Shingles prev PNA prev done.  RSV prev done.  covid to be done this fall.   Flu 2024 Advance directive- would have her husband then both of her kids designated if patient were incapacitated.

## 2023-03-04 NOTE — Assessment & Plan Note (Signed)
Advance directive- would have her husband then both of her kids designated if patient were incapacitated.

## 2023-04-06 ENCOUNTER — Ambulatory Visit: Payer: Medicare HMO | Admitting: Family Medicine

## 2023-04-14 DIAGNOSIS — L570 Actinic keratosis: Secondary | ICD-10-CM | POA: Diagnosis not present

## 2023-06-08 ENCOUNTER — Ambulatory Visit: Payer: Medicare HMO | Admitting: Dermatology

## 2023-06-08 DIAGNOSIS — L720 Epidermal cyst: Secondary | ICD-10-CM

## 2023-06-08 DIAGNOSIS — L820 Inflamed seborrheic keratosis: Secondary | ICD-10-CM | POA: Diagnosis not present

## 2023-06-08 DIAGNOSIS — D1801 Hemangioma of skin and subcutaneous tissue: Secondary | ICD-10-CM

## 2023-06-08 DIAGNOSIS — D2262 Melanocytic nevi of left upper limb, including shoulder: Secondary | ICD-10-CM | POA: Diagnosis not present

## 2023-06-08 DIAGNOSIS — L821 Other seborrheic keratosis: Secondary | ICD-10-CM

## 2023-06-08 DIAGNOSIS — D229 Melanocytic nevi, unspecified: Secondary | ICD-10-CM | POA: Diagnosis not present

## 2023-06-08 DIAGNOSIS — L738 Other specified follicular disorders: Secondary | ICD-10-CM | POA: Diagnosis not present

## 2023-06-08 DIAGNOSIS — W908XXA Exposure to other nonionizing radiation, initial encounter: Secondary | ICD-10-CM | POA: Diagnosis not present

## 2023-06-08 DIAGNOSIS — D2239 Melanocytic nevi of other parts of face: Secondary | ICD-10-CM

## 2023-06-08 DIAGNOSIS — L814 Other melanin hyperpigmentation: Secondary | ICD-10-CM | POA: Diagnosis not present

## 2023-06-08 DIAGNOSIS — L719 Rosacea, unspecified: Secondary | ICD-10-CM

## 2023-06-08 DIAGNOSIS — Z85828 Personal history of other malignant neoplasm of skin: Secondary | ICD-10-CM

## 2023-06-08 DIAGNOSIS — Z1283 Encounter for screening for malignant neoplasm of skin: Secondary | ICD-10-CM

## 2023-06-08 DIAGNOSIS — L729 Follicular cyst of the skin and subcutaneous tissue, unspecified: Secondary | ICD-10-CM

## 2023-06-08 DIAGNOSIS — L578 Other skin changes due to chronic exposure to nonionizing radiation: Secondary | ICD-10-CM | POA: Diagnosis not present

## 2023-06-08 NOTE — Progress Notes (Signed)
 New Patient Visit   Subjective  Valerie Ho is a 83 y.o. female who presents for the following: Skin Cancer Screening and Full Body Skin Exam Spot at left jaw area noticed 4  - 6 months ago. Frozen once before at another office but didn't clear up.  She has irritated spot on breast, gets caught on clothes.  Hx of bcc at right shoulder 10 - 13 years ago  The patient presents for Total-Body Skin Exam (TBSE) for skin cancer screening and mole check. The patient has spots, moles and lesions to be evaluated, some may be new or changing and the patient may have concern these could be cancer.    The following portions of the chart were reviewed this encounter and updated as appropriate: medications, allergies, medical history  Review of Systems:  No other skin or systemic complaints except as noted in HPI or Assessment and Plan.  Objective  Well appearing patient in no apparent distress; mood and affect are within normal limits.  A full examination was performed including scalp, head, eyes, ears, nose, lips, neck, chest, axillae, abdomen, back, buttocks, bilateral upper extremities, bilateral lower extremities, hands, feet, fingers, toes, fingernails, and toenails. All findings within normal limits unless otherwise noted below.   Relevant physical exam findings are noted in the Assessment and Plan.  left cheek x 1, left lateral breast x 1 (2) Erythematous stuck-on, waxy papule or plaque  Assessment & Plan   SKIN CANCER SCREENING PERFORMED TODAY.  Sebaceous Hyperplasia at face  - Small yellow papules with a central dell - Benign-appearing - Observe. Call for changes.  MILIA Exam: tiny erythematous firm white papule face  Discussed this is a type of cyst. Benign-appearing. Sometimes these will clear with OTC adapalene/Differin 0.1% cream QHS or retinol.  Discussed extraction if symptomatic.   ROSACEA Exam: Mid face erythema with telangiectasias, resolving small pink papules on  nose  Chronic and persistent condition with duration or expected duration over one year.  Rosacea is a chronic progressive skin condition usually affecting the face of adults, causing redness and/or acne bumps. It is treatable but not curable. It sometimes affects the eyes (ocular rosacea) as well. It may respond to topical and/or systemic medication and can flare with stress, sun exposure, alcohol, exercise, topical steroids (including hydrocortisone/cortisone 10) and some foods.  Daily application of broad spectrum spf 30+ sunscreen to face is recommended to reduce flares.  Patient denies grittiness of the eyes  Treatment Plan Not bothersome to patient at this time- Patient defers treatment  Discussed topical antibiotic creams if bothersome   ACTINIC DAMAGE - Chronic condition, secondary to cumulative UV/sun exposure - diffuse scaly erythematous macules with underlying dyspigmentation - Recommend daily broad spectrum sunscreen SPF 30+ to sun-exposed areas, reapply every 2 hours as needed.  - Staying in the shade or wearing long sleeves, sun glasses (UVA+UVB protection) and wide brim hats (4-inch brim around the entire circumference of the hat) are also recommended for sun protection.  - Call for new or changing lesions.  LENTIGINES, SEBORRHEIC KERATOSES, HEMANGIOMAS - Benign normal skin lesions - Benign-appearing - Call for any changes  MELANOCYTIC NEVI - Tan-brown and/or pink-flesh-colored symmetric macules and papules, including left posterior shoulder, right temporal hairline  - Benign appearing on exam today - Observation - Call clinic for new or changing moles - Recommend daily use of broad spectrum spf 30+ sunscreen to sun-exposed areas.    EPIDERMAL INCLUSION CYST Exam: Firm subcutaneous nodule at left lower back  at sacrum  Benign-appearing. Exam most consistent with an epidermal inclusion cyst. Discussed that a cyst is a benign growth that can grow over time and sometimes  get irritated or inflamed. Recommend observation if it is not bothersome. Discussed option of surgical excision to remove it if it is growing, symptomatic, or other changes noted. Please call for new or changing lesions so they can be evaluated.    HISTORY OF BASAL CELL CARCINOMA OF THE SKIN At right shoulder - patient reports 10 - 13 years ago - No evidence of recurrence today - Recommend regular full body skin exams - Recommend daily broad spectrum sunscreen SPF 30+ to sun-exposed areas, reapply every 2 hours as needed.  - Call if any new or changing lesions are noted between office visits   INFLAMED SEBORRHEIC KERATOSIS (2) left cheek x 1, left lateral breast x 1 (2) Symptomatic, irritating, patient would like treated. 2nd treatment left cheek Destruction of lesion - left cheek x 1, left lateral breast x 1 (2)  Destruction method: cryotherapy   Informed consent: discussed and consent obtained   Lesion destroyed using liquid nitrogen: Yes   Region frozen until ice ball extended beyond lesion: Yes   Outcome: patient tolerated procedure well with no complications   Post-procedure details: wound care instructions given   Additional details:  Prior to procedure, discussed risks of blister formation, small wound, skin dyspigmentation, or rare scar following cryotherapy. Recommend Vaseline ointment to treated areas while healing.   Return in about 2 months (around 08/08/2023) for isk recheck at left cheek, 1 year tbse.  I, Asher Muir, CMA, am acting as scribe for Willeen Niece, MD.   Documentation: I have reviewed the above documentation for accuracy and completeness, and I agree with the above.  Willeen Niece, MD

## 2023-06-08 NOTE — Patient Instructions (Addendum)
 Recommend OTC Zeasorb AF powder to body folds daily after shower.  It is often found in the athlete's foot section in the pharmacy.  Avoid using powders that contain cornstarch.       Seborrheic Keratosis  What causes seborrheic keratoses? Seborrheic keratoses are harmless, common skin growths that first appear during adult life.  As time goes by, more growths appear.  Some people may develop a large number of them.  Seborrheic keratoses appear on both covered and uncovered body parts.  They are not caused by sunlight.  The tendency to develop seborrheic keratoses can be inherited.  They vary in color from skin-colored to gray, brown, or even black.  They can be either smooth or have a rough, warty surface.   Seborrheic keratoses are superficial and look as if they were stuck on the skin.  Under the microscope this type of keratosis looks like layers upon layers of skin.  That is why at times the top layer may seem to fall off, but the rest of the growth remains and re-grows.    Treatment Seborrheic keratoses do not need to be treated, but can easily be removed in the office.  Seborrheic keratoses often cause symptoms when they rub on clothing or jewelry.  Lesions can be in the way of shaving.  If they become inflamed, they can cause itching, soreness, or burning.  Removal of a seborrheic keratosis can be accomplished by freezing, burning, or surgery. If any spot bleeds, scabs, or grows rapidly, please return to have it checked, as these can be an indication of a skin cancer.   Cryotherapy Aftercare  Wash gently with soap and water everyday.   Apply Vaseline and Band-Aid daily until healed.    Melanoma ABCDEs  Melanoma is the most dangerous type of skin cancer, and is the leading cause of death from skin disease.  You are more likely to develop melanoma if you: Have light-colored skin, light-colored eyes, or red or blond hair Spend a lot of time in the sun Tan regularly, either outdoors  or in a tanning bed Have had blistering sunburns, especially during childhood Have a close family member who has had a melanoma Have atypical moles or large birthmarks  Early detection of melanoma is key since treatment is typically straightforward and cure rates are extremely high if we catch it early.   The first sign of melanoma is often a change in a mole or a new dark spot.  The ABCDE system is a way of remembering the signs of melanoma.  A for asymmetry:  The two halves do not match. B for border:  The edges of the growth are irregular. C for color:  A mixture of colors are present instead of an even brown color. D for diameter:  Melanomas are usually (but not always) greater than 6mm - the size of a pencil eraser. E for evolution:  The spot keeps changing in size, shape, and color.  Please check your skin once per month between visits. You can use a small mirror in front and a large mirror behind you to keep an eye on the back side or your body.   If you see any new or changing lesions before your next follow-up, please call to schedule a visit.  Please continue daily skin protection including broad spectrum sunscreen SPF 30+ to sun-exposed areas, reapplying every 2 hours as needed when you're outdoors.   Staying in the shade or wearing long sleeves, sun glasses (UVA+UVB protection)  and wide brim hats (4-inch brim around the entire circumference of the hat) are also recommended for sun protection.    Due to recent changes in healthcare laws, you may see results of your pathology and/or laboratory studies on MyChart before the doctors have had a chance to review them. We understand that in some cases there may be results that are confusing or concerning to you. Please understand that not all results are received at the same time and often the doctors may need to interpret multiple results in order to provide you with the best plan of care or course of treatment. Therefore, we ask that  you please give [place] 2 business days to thoroughly review all your results before contacting the office for clarification. Should we see a critical lab result, you will be contacted sooner.   If You Need Anything After Your Visit  If you have any questions or concerns for your doctor, please call our main line at [phone] and press option 4 to reach your doctor's medical assistant. If no one answers, please leave a voicemail as directed and we will return your call as soon as possible. Messages left after 4 pm will be answered the following business day.   You may also send [place] a message via MyChart. We typically respond to MyChart messages within 1-2 business days.  For prescription refills, please ask your pharmacy to contact our office. Our fax number is [phone].  If you have an urgent issue when the clinic is closed that cannot wait until the next business day, you can page your doctor at the number below.    Please note that while we do our best to be available for urgent issues outside of office hours, we are not available 24/7.   If you have an urgent issue and are unable to reach [place], you may choose to seek medical care at your doctor's office, retail clinic, urgent care center, or emergency room.  If you have a medical emergency, please immediately call 911 or go to the emergency department.  Pager Numbers  - Dr. Gwen Pounds: [phone]  - Dr. Roseanne Reno: [phone]  - Dr. Katrinka Blazing: [phone]   In the event of inclement weather, please call our main line at [phone] for an update on the status of any delays or closures.  Dermatology Medication Tips: Please keep the boxes that topical medications come in in order to help keep track of the instructions about where and how to use these. Pharmacies typically print the medication instructions only on the boxes and not directly on the medication tubes.   If your medication is too expensive, please contact our office at  [phone] option 4 or send [place] a message through MyChart.   We are unable to tell what your co-pay for medications will be in advance as this is different depending on your insurance coverage. However, we may be able to find a substitute medication at lower cost or fill out paperwork to get insurance to cover a needed medication.   If a prior authorization is required to get your medication covered by your insurance company, please allow [place] 1-2 business days to complete this process.  Drug prices often vary depending on where the prescription is filled and some pharmacies may offer cheaper prices.  The website www.goodrx.com contains coupons for medications through different pharmacies. The prices here do not account for what the cost may be with help from insurance (it may be cheaper with your insurance), but the website  can give you the price if you did not use any insurance.  - You can print the associated coupon and take it with your prescription to the pharmacy.  - You may also stop by our office during regular business hours and pick up a GoodRx coupon card.  - If you need your prescription sent electronically to a different pharmacy, notify our office through [facility] or by phone at [phone] option 4.     Si Usted Necesita Algo Despus de Su Visita  Tambin puede enviarnos un mensaje a travs de Clinical cytogeneticist. Por lo general respondemos a los mensajes de MyChart en el transcurso de 1 a 2 das hbiles.  Para renovar recetas, por favor pida a su farmacia que se ponga en contacto con nuestra oficina. Annie Sable de fax es [place] [phone].  Si tiene un asunto urgente cuando la clnica est cerrada y que no puede esperar hasta el siguiente da hbil, puede llamar/localizar a su doctor(a) al nmero que aparece a continuacin.   Por favor, tenga en cuenta que aunque hacemos todo lo posible para estar disponibles para asuntos urgentes fuera del horario de [place], no estamos  disponibles las 24 horas del da, los 7 [address]  [address] de la [place].   Si tiene un problema urgente y no puede comunicarse con nosotros, puede optar por buscar atencin mdica  en el consultorio de su doctor(a), en una clnica privada, en un centro de atencin urgente o en una sala de emergencias.  Si tiene Engineer, drilling, por favor llame inmediatamente al 911 o vaya a la sala de emergencias.  Nmeros de bper  - Dr. Gwen Pounds: [phone]  - Dra. Roseanne Reno: [phone]  - Dr. Katrinka Blazing: [phone]   En caso de inclemencias del tiempo, por favor llame a Lacy Duverney principal al [phone] para una actualizacin sobre el [place] de cualquier retraso o cierre.  Consejos para la medicacin en dermatologa: Por favor, guarde las cajas en las que vienen los medicamentos de uso tpico para ayudarle a seguir las instrucciones sobre dnde y cmo usarlos. Las farmacias generalmente imprimen las instrucciones del medicamento slo en las cajas y no directamente en los tubos del [place].   Si su medicamento es muy caro, por favor, pngase en contacto con Rolm Gala llamando al [phone] y presione la opcin 4 o envenos un mensaje a travs de Clinical cytogeneticist.   No podemos decirle cul ser su copago por los medicamentos por adelantado ya que esto es diferente dependiendo de la cobertura de su seguro. Sin embargo, es posible que podamos encontrar un medicamento sustituto a Audiological scientist un formulario para que el seguro cubra el medicamento que se considera necesario.   Si se requiere una autorizacin previa para que su compaa de seguros [place] su medicamento, por favor permtanos de 1 a 2 das hbiles para completar [address]  Los precios de los medicamentos varan con frecuencia dependiendo del Environmental consultant de dnde se surte la receta y alguna farmacias pueden ofrecer precios ms baratos.  El sitio web www.goodrx.com tiene cupones para medicamentos de Health and safety inspector. Los precios aqu no  tienen en cuenta lo que podra costar con la ayuda del seguro (puede ser ms barato con su seguro), pero el sitio web puede darle el precio si no utiliz Tourist information centre manager.  - Puede imprimir el cupn correspondiente y llevarlo con su receta a la farmacia.  - Tambin puede pasar por nuestra oficina durante el horario de atencin regular y Education officer, museum una tarjeta de cupones de GoodRx.  -  Si necesita que su receta se enve electrnicamente a Psychiatrist, informe a nuestra oficina a travs de MyChart de [place] o por telfono llamando al [phone] y presione la opcin 4.

## 2023-06-10 ENCOUNTER — Other Ambulatory Visit: Payer: Self-pay | Admitting: Family Medicine

## 2023-06-10 DIAGNOSIS — Z1231 Encounter for screening mammogram for malignant neoplasm of breast: Secondary | ICD-10-CM

## 2023-07-01 ENCOUNTER — Ambulatory Visit

## 2023-07-02 ENCOUNTER — Ambulatory Visit
Admission: RE | Admit: 2023-07-02 | Discharge: 2023-07-02 | Disposition: A | Discharge status: Home / Self Care | Source: Ambulatory Visit | Attending: Family Medicine | Admitting: Family Medicine

## 2023-07-02 DIAGNOSIS — Z1231 Encounter for screening mammogram for malignant neoplasm of breast: Secondary | ICD-10-CM | POA: Diagnosis not present

## 2023-08-10 ENCOUNTER — Ambulatory Visit: Admitting: Dermatology

## 2023-08-28 ENCOUNTER — Ambulatory Visit (INDEPENDENT_AMBULATORY_CARE_PROVIDER_SITE_OTHER)

## 2023-08-28 VITALS — Ht 62.5 in | Wt 138.0 lb

## 2023-08-28 DIAGNOSIS — Z0000 Encounter for general adult medical examination without abnormal findings: Secondary | ICD-10-CM

## 2023-08-28 NOTE — Progress Notes (Signed)
 Subjective:   Valerie Ho is a 83 y.o. who presents for a Medicare Wellness preventive visit.  As a reminder, Annual Wellness Visits don't include a physical exam, and some assessments may be limited, especially if this visit is performed virtually. We may recommend an in-person follow-up visit with your provider if needed.  Visit Complete: Virtual I connected with  [name] on 08/28/23 by a audio enabled telemedicine application and verified that I am speaking with the correct person using two identifiers.  Patient Location: Home  Provider Location: Office/Clinic  I discussed the limitations of evaluation and management by telemedicine. The patient expressed understanding and agreed to proceed.  Vital Signs: Because this visit was a virtual/telehealth visit, some criteria may be missing or patient reported. Any vitals not documented were not able to be obtained and vitals that have been documented are patient reported.  VideoDeclined- This patient declined Librarian, academic. Therefore the visit was completed with audio only.  Persons Participating in Visit: Patient.  AWV Questionnaire: No: Patient Medicare AWV questionnaire was not completed prior to this visit.  Cardiac Risk Factors include: advanced age (>28men, >34 women);dyslipidemia     Objective:     Today's Vitals   08/28/23 1055  Weight: 138 lb (62.6 kg)  Height: 5' 2.5" (1.588 m)   Body mass index is 24.84 kg/m.     08/28/2023   11:10 AM 07/01/2022    2:48 PM 05/29/2021    8:25 AM 11/08/2019    3:32 PM 08/23/2018   10:32 AM 08/12/2017    9:26 AM 08/08/2016    9:17 AM  Advanced Directives  Does Patient Have a Medical Advance Directive? Yes Yes Yes Yes Yes Yes No  Type of Estate agent of [place];Living will Healthcare Power of [place];Living will Healthcare Power of [place];Living will Healthcare Power of [place];Living will Healthcare Power of  [place];Living will Healthcare Power of [place];Living will   Does patient want to make changes to medical advance directive?  No - Patient declined Yes (MAU/Ambulatory/Procedural Areas - Information given)      Copy of Healthcare Power of Attorney in Chart? Yes - validated most recent copy scanned in chart (See row information) Yes - validated most recent copy scanned in chart (See row information) Yes - validated most recent copy scanned in chart (See row information) Yes - validated most recent copy scanned in chart (See row information) Yes - validated most recent copy scanned in chart (See row information) No - copy requested     Current Medications (verified) Outpatient Encounter Medications as of 08/28/2023  Medication Sig   Calcium Carbonate-Vitamin D  (CALCIUM-VITAMIN D ) 600-200 MG-UNIT CAPS Take 1 tablet by mouth daily.   Cholecalciferol (VITAMIN D ) 50 MCG (2000 UT) tablet Take 2,000 Units by mouth daily.   Coenzyme Q10 (COQ-10) 200 MG CAPS Take 1 capsule by mouth daily.   Ibuprofen 200 MG CAPS Take 1-2 capsules by mouth at bedtime as needed.   levothyroxine  (SYNTHROID ) 75 MCG tablet Take 1 tablet (75 mcg total) by mouth daily.   Omega-3 Fatty Acids (FISH OIL) 1000 MG CAPS Take 1,000 capsules by mouth daily.   mirabegron  ER (MYRBETRIQ ) 50 MG TB24 tablet Take 1 tablet (50 mg total) by mouth daily. (Patient not taking: Reported on 08/28/2023)   No facility-administered encounter medications on file as of 08/28/2023.    Allergies (verified) Oxycodone  and Propoxyphene n-acetaminophen    History: Past Medical History:  Diagnosis Date   BCC (basal  cell carcinoma of skin)    Cataract    Compression fracture of L2 (HCC) 2014   after a fall    Hyperlipidemia    Hypothyroid    Lactose intolerance    Osteopenia    Past Surgical History:  Procedure Laterality Date   BACK SURGERY  1988   lumbar disc surgery   Family History  Problem Relation Age of Onset   Heart disease Mother         chf   Cancer Paternal Uncle    Diabetes Maternal Grandfather    Colon cancer Neg Hx    Breast cancer Neg Hx    BRCA 1/2 Neg Hx    Social History   Socioeconomic History   Marital status: Married    Spouse name: Not on file   Number of children: Not on file   Years of education: Not on file   Highest education level: Not on file  Occupational History   Not on file  Tobacco Use   Smoking status: Never   Smokeless tobacco: Never  Vaping Use   Vaping status: Never Used  Substance and Sexual Activity   Alcohol use: No    Alcohol/week: 0.0 standard drinks of alcohol   Drug use: No   Sexual activity: Not Currently  Other Topics Concern   Not on file  Social History Narrative   From [place]   Retired from [employer], [employer]   Enjoys knitting, reading, crosswords   Married 1961   Social Drivers of Corporate investment banker Strain: Low Risk  (08/28/2023)   Overall Financial Resource Strain (CARDIA)    Difficulty of Paying Living Expenses: Not hard at all  Food Insecurity: No Food Insecurity (08/28/2023)   Hunger Vital Sign    Worried About Running Out of Food in the Last Year: Never true    Ran Out of Food in the Last Year: Never true  Transportation Needs: No Transportation Needs (08/28/2023)   PRAPARE - Administrator, Civil Service (Medical): No    Lack of Transportation (Non-Medical): No  Physical Activity: Insufficiently Active (08/28/2023)   Exercise Vital Sign    Days of Exercise per Week: 3 days    Minutes of Exercise per Session: 30 min  Stress: No Stress Concern Present (08/28/2023)   [employer] of Occupational Health - Occupational Stress Questionnaire    Feeling of Stress : Not at all  Social Connections: Socially Integrated (08/28/2023)   Social Connection and Isolation Panel [NHANES]    Frequency of Communication with Friends and Family: More than three times a week    Frequency of Social Gatherings with Friends and Family:  More than three times a week    Attends Religious Services: More than 4 times per year    Active Member of [employer] or Organizations: Yes    Attends Engineer, structural: More than 4 times per year    Marital Status: Married    Tobacco Counseling Counseling given: Not Answered    Clinical Intake:  Pre-visit preparation completed: Yes  Pain : No/denies pain     BMI - recorded: 24.84 Nutritional Status: BMI of 19-24  Normal Nutritional Risks: None Diabetes: No  No results found for: "HGBA1C"   How often do you need to have someone help you when you read instructions, pamphlets, or other written materials from your doctor or pharmacy?: 1 - Never  Interpreter Needed?: No  Comments: lives with husband Information entered by ::  B.Nelwyn Hebdon,LPN   Activities of Daily Living     08/28/2023   11:10 AM  In your present state of health, do you have any difficulty performing the following activities:  Hearing? 0  Vision? 0  Difficulty concentrating or making decisions? 0  Walking or climbing stairs? 0  Dressing or bathing? 0  Doing errands, shopping? 0  Preparing Food and eating ? N  Using the Toilet? N  In the past six months, have you accidently leaked urine? Y  Do you have problems with loss of bowel control? N  Managing your Medications? N  Managing your Finances? N  Housekeeping or managing your Housekeeping? N    Patient Care Team: Donnie Galea, MD as PCP - General (Family Medicine) Alvina Axon, MD as Consulting Physician (Ophthalmology)  Indicate any recent Medical Services you may have received from other than [facility] providers in the past year (date may be approximate).     Assessment:    This is a routine wellness examination for Aailyah.  Hearing/Vision screen Hearing Screening - Comments:: Pt says she wears hearing aids and hear well with those Vision Screening - Comments:: Pt says vision is good;needs readers noly July annual eye exam-Dr  Terrall Ferraris   Goals Addressed             This Visit's Progress    Patient Stated   On track    08/28/23- I will continue to take medication as prescribed.     Patient Stated   On track    08/28/23-Would like maintain current routine     Patient Stated   On track    08/28/23-Stay active.       Depression Screen     08/28/2023   11:07 AM 03/02/2023    2:04 PM 07/01/2022    2:47 PM 06/23/2022   10:35 AM 05/29/2021    8:26 AM 12/31/2020   12:20 PM 11/08/2019    3:36 PM  PHQ 2/9 Scores  PHQ - 2 Score 0 0 0 0 0 0 0  PHQ- 9 Score  0     0    Fall Risk     08/28/2023   11:01 AM 03/02/2023    2:04 PM 07/01/2022    2:48 PM 06/23/2022   10:35 AM 05/29/2021    8:26 AM  Fall Risk   Falls in the past year? 0 0 0 0 0  Number falls in past yr: 0 0 0 0 0  Injury with Fall? 0 0 0 0 0  Risk for fall due to : No Fall Risks No Fall Risks No Fall Risks  No Fall Risks  Follow up Falls prevention discussed;Education provided Falls evaluation completed Falls prevention discussed;Falls evaluation completed Falls evaluation completed;Education provided;Falls prevention discussed Falls prevention discussed    MEDICARE RISK AT HOME:  Medicare Risk at Home Any stairs in or around the home?: Yes If so, are there any without handrails?: Yes Home free of loose throw rugs in walkways, pet beds, electrical cords, etc?: Yes Adequate lighting in your home to reduce risk of falls?: Yes Life alert?: No Use of a cane, walker or w/c?: No Grab bars in the bathroom?: Yes Shower chair or bench in shower?: Yes Elevated toilet seat or a handicapped toilet?: Yes  TIMED UP AND GO:  Was the test performed?  No  Cognitive Function: 6CIT completed    11/08/2019    3:37 PM 08/23/2018   10:32 AM 08/19/2018   10:05 AM 08/12/2017  9:26 AM 08/08/2016    9:18 AM  MMSE - Mini Mental State Exam  Orientation to time 5 5 5 5 5   Orientation to Place 5 5 5 5 5   Registration 3 3 3 3 3   Attention/ Calculation 5 0 0 0 0   Recall 3 3 3 3 3   Language- name 2 objects  0 0 0 0  Language- repeat 1 1 1 1 1   Language- follow 3 step command  0 0 3 3  Language- read & follow direction  0 0 0 0  Write a sentence  0 0 0 0  Copy design  0 0 0 0  Total score  17 17 20 20         08/28/2023   11:11 AM 07/01/2022    2:50 PM  6CIT Screen  What Year? 0 points 0 points  What month? 0 points 0 points  What time? 0 points 0 points  Count back from 20 0 points 0 points  Months in reverse 0 points 0 points  Repeat phrase 0 points 0 points  Total Score 0 points 0 points    Immunizations Immunization History  Administered Date(s) Administered   Fluad Quad(high Dose 65+) 01/31/2022   Influenza Split 01/20/2012   Influenza, High Dose Seasonal PF 02/06/2017, 02/17/2023   Influenza,inj,Quad PF,6+ Mos 03/08/2013, 03/14/2016   PFIZER(Purple Top)SARS-COV-2 Vaccination 05/02/2019, 05/23/2019   Pneumococcal Conjugate-13 07/25/2014   Pneumococcal Polysaccharide-23 01/20/2012   Rsv, Bivalent, Protein Subunit Rsvpref,pf (Abrysvo) 04/02/2022   Td 12/30/2007   Zoster Recombinant(Shingrix) 09/20/2021, 05/20/2022    Screening Tests Health Maintenance  Topic Date Due   DTaP/Tdap/Td (2 - Tdap) 12/29/2017   COVID-19 Vaccine (3 - Pfizer risk series) 06/20/2019   Medicare Annual Wellness (AWV)  07/01/2023   INFLUENZA VACCINE  11/06/2023   MAMMOGRAM  07/01/2024   Pneumonia Vaccine 38+ Years old  Completed   DEXA SCAN  Completed   Zoster Vaccines- Shingrix  Completed   HPV VACCINES  Aged Out   Meningococcal B Vaccine  Aged Out    Health Maintenance  Health Maintenance Due  Topic Date Due   DTaP/Tdap/Td (2 - Tdap) 12/29/2017   COVID-19 Vaccine (3 - Pfizer risk series) 06/20/2019   Medicare Annual Wellness (AWV)  07/01/2023   Health Maintenance Items Addressed: None needed at this time  Additional Screening:  Vision Screening: Recommended annual ophthalmology exams for early detection of glaucoma and other disorders  of the eye.  Dental Screening: Recommended annual dental exams for proper oral hygiene  Community Resource Referral / Chronic Care Management: CRR required this visit?  No   CCM required this visit?  No pt will schedule:due 11/25   Plan:    I have personally reviewed and noted the following in the patient's chart:   Medical and social history Use of alcohol, tobacco or illicit drugs  Current medications and supplements including opioid prescriptions. Patient is not currently taking opioid prescriptions. Functional ability and status Nutritional status Physical activity Advanced directives List of other physicians Hospitalizations, surgeries, and ER visits in previous 12 months Vitals Screenings to include cognitive, depression, and falls Referrals and appointments  In addition, I have reviewed and discussed with patient certain preventive protocols, quality metrics, and best practice recommendations. A written personalized care plan for preventive services as well as general preventive health recommendations were provided to patient.   Nerissa Bannister, LPN   07/14/8117   After Visit Summary: (MyChart) Due to this being a telephonic visit,  the after visit summary with patients personalized plan was offered to patient via MyChart   Notes: Nothing significant to report at this time.

## 2023-08-28 NOTE — Patient Instructions (Addendum)
 Valerie Ho , Thank you for taking time out of your busy schedule to complete your Annual Wellness Visit with me. I enjoyed our conversation and look forward to speaking with you again next year. I, as well as your care team,  appreciate your ongoing commitment to your health goals. Please review the following plan we discussed and let me know if I can assist you in the future. Your Game plan/ To Do List     Follow up Visits: Next Medicare AWV with our clinical staff: 08/31/24 @ 10:50am televisit   Have you seen your provider in the last 6 months (3 months if uncontrolled diabetes)? yes Next Office Visit with your provider: pt to schedule PE for Nov25 soon  Clinician Recommendations:  Aim for 30 minutes of exercise or brisk walking, 6-8 glasses of water, and 5 servings of fruits and vegetables each day.       This is a list of the screening recommended for you and due dates:  Health Maintenance  Topic Date Due   DTaP/Tdap/Td vaccine (2 - Tdap) 12/29/2017   COVID-19 Vaccine (3 - Pfizer risk series) 06/20/2019   Medicare Annual Wellness Visit  07/01/2023   Flu Shot  11/06/2023   Mammogram  07/01/2024   Pneumonia Vaccine  Completed   DEXA scan (bone density measurement)  Completed   Zoster (Shingles) Vaccine  Completed   HPV Vaccine  Aged Out   Meningitis B Vaccine  Aged Out    Advanced directives: (In Chart) A copy of your advanced directives are scanned into your chart should your provider ever need it. Advance Care Planning is important because it:  [x]  Makes sure you receive the medical care that is consistent with your values, goals, and preferences  [x]  It provides guidance to your family and loved ones and reduces their decisional burden about whether or not they are making the right decisions based on your wishes.  Follow the link provided in your after visit summary or read over the paperwork we have mailed to you to help you started getting your Advance Directives in place. If  you need assistance in completing these, please reach out to us  so that we can help you!

## 2023-10-07 DIAGNOSIS — H43812 Vitreous degeneration, left eye: Secondary | ICD-10-CM | POA: Diagnosis not present

## 2023-10-07 DIAGNOSIS — H25013 Cortical age-related cataract, bilateral: Secondary | ICD-10-CM | POA: Diagnosis not present

## 2023-10-07 DIAGNOSIS — H2513 Age-related nuclear cataract, bilateral: Secondary | ICD-10-CM | POA: Diagnosis not present

## 2023-12-03 DIAGNOSIS — M791 Myalgia, unspecified site: Secondary | ICD-10-CM | POA: Diagnosis not present

## 2023-12-03 DIAGNOSIS — M545 Low back pain, unspecified: Secondary | ICD-10-CM | POA: Diagnosis not present

## 2024-01-12 ENCOUNTER — Telehealth: Payer: Self-pay | Admitting: *Deleted

## 2024-01-12 NOTE — Telephone Encounter (Signed)
 Patient has been informed in regards to [place].

## 2024-01-12 NOTE — Telephone Encounter (Signed)
 TC from pt.  Pt is due for her one year follow up.  I will get set up. She stopped taking the Myrbetriq  due to cost earlier this year and now back on pads due to leaking.  She didn't call us  to let us  know that it was too expensive.  She is asking if the [place] bladder control over the counter.  Have you heard of this? Thanks Sotero

## 2024-01-12 NOTE — Telephone Encounter (Signed)
 LM for pt to call back. Sotero CMA

## 2024-03-12 ENCOUNTER — Other Ambulatory Visit: Payer: Self-pay | Admitting: Family Medicine

## 2024-04-04 ENCOUNTER — Encounter: Payer: Self-pay | Admitting: Family Medicine

## 2024-04-04 ENCOUNTER — Ambulatory Visit (INDEPENDENT_AMBULATORY_CARE_PROVIDER_SITE_OTHER): Admitting: Family Medicine

## 2024-04-04 VITALS — BP 122/56 | HR 98 | Temp 98.6°F | Resp 16 | Ht 62.5 in | Wt 140.0 lb

## 2024-04-04 DIAGNOSIS — R059 Cough, unspecified: Secondary | ICD-10-CM | POA: Diagnosis not present

## 2024-04-04 DIAGNOSIS — J101 Influenza due to other identified influenza virus with other respiratory manifestations: Secondary | ICD-10-CM

## 2024-04-04 LAB — POCT INFLUENZA A/B
Influenza A, POC: POSITIVE — AB
Influenza B, POC: NEGATIVE

## 2024-04-04 LAB — POC COVID19 BINAXNOW: SARS Coronavirus 2 Ag: NEGATIVE

## 2024-04-04 NOTE — Progress Notes (Signed)
 "     Acute visit   Patient: Valerie Ho   DOB: 1941/04/01   83 y.o. Female  MRN: [id number] PCP: Cleatus Arlyss RAMAN, MD   Chief Complaint  Patient presents with   Acute Visit    Cough, runny nose, fever 100.3 last night x 3 days otc: mucinex, tylenol    Subjective    Discussed the use of AI scribe software for clinical note transcription with the patient, who gave verbal consent to proceed.  History of Present Illness   Valerie Ho is an 83 year old female who presents with flu symptoms.  She has felt unwell for about three days with gradually worsening symptoms and is worried about bronchitis given past episodes. She notes wheezing and a rattling sensation with cough.  She tested positive for influenza this year despite having received the flu vaccine. Symptoms include cough, sneezing, nosebleeds, and fever, with one particularly severe night.  She is taking Mucinex 1200 mg 12-hour formulation for mucus thinning.       Review of Systems  Objective    BP (!) 122/56   Pulse 98   Temp 98.6 F (37 C) (Oral)   Resp 16   Ht 5' 2.5 (1.588 m)   Wt 140 lb (63.5 kg)   SpO2 97%   BMI 25.20 kg/m  Physical Exam Vitals reviewed.  Constitutional:      General: She is not in acute distress.    Appearance: Normal appearance. She is well-developed. She is not diaphoretic.  HENT:     Head: Normocephalic and atraumatic.     Right Ear: Tympanic membrane, ear canal and external ear normal.     Left Ear: Tympanic membrane, ear canal and external ear normal.     Nose: Congestion present.     Mouth/Throat:     Mouth: Mucous membranes are moist.     Pharynx: Oropharynx is clear. No oropharyngeal exudate.  Eyes:     General: No scleral icterus.    Conjunctiva/sclera: Conjunctivae normal.     Pupils: Pupils are equal, round, and reactive to light.  Cardiovascular:     Rate and Rhythm: Normal rate and regular rhythm.     Heart sounds: Normal heart sounds.  Pulmonary:      Effort: Pulmonary effort is normal. No respiratory distress.     Breath sounds: Normal breath sounds. No wheezing or rales.  Musculoskeletal:     Cervical back: Neck supple.     Right lower leg: No edema.     Left lower leg: No edema.  Lymphadenopathy:     Cervical: No cervical adenopathy.  Skin:    General: Skin is warm and dry.  Neurological:     Mental Status: She is alert.       Results for orders placed or performed in visit on 04/04/24  POC COVID-19  Result Value Ref Range   SARS Coronavirus 2 Ag Negative Negative  POCT Influenza A/B  Result Value Ref Range   Influenza A, POC Positive (A) Negative   Influenza B, POC Negative Negative    Assessment & Plan     Problem List Items Addressed This Visit   None Visit Diagnoses       Influenza A    -  Primary     Cough, unspecified type       Relevant Orders   POC COVID-19 (Completed)   POCT Influenza A/B (Completed)           Acute  influenza infection Confirmed by positive swab. Symptoms include cough, wheeze, nasal congestion, and fever. No current bronchitis, but risk of progression exists. She is on the edge of the Tamiflu window (48-72 hours from symptom onset) and declined Tamiflu due to mild symptoms and recent flu vaccination, which should reduce severity and risk of hospitalization. - Continue Mucinex for mucus thinning. - Use Flonase nasal spray, two sprays in each nostril once daily. - Use nasal saline for congestion relief. - Use Tylenol  for fever management. - Consider honey for cough relief. - Monitor symptoms and contact healthcare provider if symptoms worsen or do not improve.        No orders of the defined types were placed in this encounter.    Return if symptoms worsen or fail to improve.      Jon Eva, MD  [facility] [phone] (phone) [phone] (fax)  [facility]  "

## 2024-04-11 ENCOUNTER — Ambulatory Visit: Payer: Self-pay

## 2024-04-11 NOTE — Telephone Encounter (Signed)
 FYI Only or Action Required?: Action required by provider: clinical question for provider.  Patient was last seen in primary care on 04/04/2024 by Myrla Jon HERO, MD.  Called Nurse Triage reporting Cough.  Symptoms began several weeks ago.  Interventions attempted: OTC medications: Mucinex.  Symptoms are: stable.  Triage Disposition: See PCP When Office is Open (Within 3 Days)  Patient/caregiver understands and will follow disposition?: No, refuses disposition   Copied from CRM [id number]. Topic: Clinical - Red Word Triage >> Apr 11, 2024  9:48 AM Nathanel BROCKS wrote: Red Word that prompted transfer to Nurse Triage:   Pt was seen last week and had the flu, she still has a really bad cough and wants to know if she can take robitussin for the cough? She is blowing bloody mucus from nose.  Reason for Disposition  [1] Nasal discharge AND [2] present > 10 days  Answer Assessment - Initial Assessment Questions Patient called asking if she can take Robitussin for the ongoing cough. She says she's taking Mucinex 1200 mg every 12 hours, coughing up greyish-beige colored mucus, blowing out of her nose a bloody mucus, denies fever, denies SOB. Advised an appointment would be beneficial with Dr. Cleatus. She says she needs a physical anyway, scheduled for 04/21/24. Advised an evaluation for the colored mucus she's having. She says she only wanted to know about Robitussin. Advised per OV notes on 04/04/24 to use Flonase nasal spray, she says she will start using that for the nasal congestion. Advised yes to Robitussin for the cough. She declined a visit this week, says she's not sick and feels like it's a waste of time, she will just see Dr. Cleatus for her physical, use flonase, and take Robitussin and Mucinex. Advised I will send this to Dr. Cleatus for review.    1. DIAGNOSIS CONFIRMATION: When was the influenza diagnosed? By whom? Did you get a test for it?     04/04/24 by Dr. Myrla at  [facility]  2. INFLUENZA MEDICINES: Were you prescribed any medicines for the influenza?  (e.g., zanamivir [Relenza], oseltamivir [Tamiflu]).     No-patient declined  3. SYMPTOMS: What is your main symptom or concern? (e.g., cough, fever, shortness of breath, muscle aches)     Cough  4. ONSET: When did the symptoms start?      2 days before visit on 12/29  5. COUGH: Do you have a cough? If Yes, ask: How bad is the cough?       Yes-gray colored, dark beige color  6. FEVER: Do you have a fever? If Yes, ask: What is your temperature, how was it measured, and when did it start?     No  7. BREATHING DIFFICULTY: Are you having any difficulty breathing? (e.g., normal; shortness of breath, wheezing, unable to speak)      No   9. HIGH RISK FOR COMPLICATIONS: Do you have any chronic medical problems? (e.g., asthma, heart or lung disease, obesity, weak immune system)     No  Protocols used: Influenza (Flu) Follow-up Call-A-AH

## 2024-04-11 NOTE — Telephone Encounter (Signed)
 Noted. Thanks.

## 2024-04-18 ENCOUNTER — Encounter: Payer: Self-pay | Admitting: *Deleted

## 2024-04-21 ENCOUNTER — Ambulatory Visit: Admitting: Family Medicine

## 2024-04-21 ENCOUNTER — Encounter: Payer: Self-pay | Admitting: Family Medicine

## 2024-04-21 VITALS — BP 108/56 | HR 73 | Temp 97.8°F | Ht 63.0 in | Wt 137.0 lb

## 2024-04-21 DIAGNOSIS — E039 Hypothyroidism, unspecified: Secondary | ICD-10-CM

## 2024-04-21 DIAGNOSIS — N393 Stress incontinence (female) (male): Secondary | ICD-10-CM | POA: Diagnosis not present

## 2024-04-21 DIAGNOSIS — Z7189 Other specified counseling: Secondary | ICD-10-CM

## 2024-04-21 DIAGNOSIS — E785 Hyperlipidemia, unspecified: Secondary | ICD-10-CM

## 2024-04-21 DIAGNOSIS — M8580 Other specified disorders of bone density and structure, unspecified site: Secondary | ICD-10-CM | POA: Diagnosis not present

## 2024-04-21 DIAGNOSIS — Z0000 Encounter for general adult medical examination without abnormal findings: Secondary | ICD-10-CM

## 2024-04-21 LAB — TSH: TSH: 2.08 u[IU]/mL (ref 0.35–5.50)

## 2024-04-21 LAB — COMPREHENSIVE METABOLIC PANEL WITH GFR
ALT: 10 U/L (ref 3–35)
AST: 14 U/L (ref 5–37)
Albumin: 3.8 g/dL (ref 3.5–5.2)
Alkaline Phosphatase: 68 U/L (ref 39–117)
BUN: 10 mg/dL (ref 6–23)
CO2: 31 meq/L (ref 19–32)
Calcium: 8.9 mg/dL (ref 8.4–10.5)
Chloride: 103 meq/L (ref 96–112)
Creatinine, Ser: 0.79 mg/dL (ref 0.40–1.20)
GFR: 69.08 mL/min
Glucose, Bld: 86 mg/dL (ref 70–99)
Potassium: 4.4 meq/L (ref 3.5–5.1)
Sodium: 138 meq/L (ref 135–145)
Total Bilirubin: 0.4 mg/dL (ref 0.2–1.2)
Total Protein: 7 g/dL (ref 6.0–8.3)

## 2024-04-21 LAB — LIPID PANEL
Cholesterol: 168 mg/dL (ref 28–200)
HDL: 43.2 mg/dL
LDL Cholesterol: 101 mg/dL — ABNORMAL HIGH (ref 10–99)
NonHDL: 125.03
Total CHOL/HDL Ratio: 4
Triglycerides: 120 mg/dL (ref 10.0–149.0)
VLDL: 24 mg/dL (ref 0.0–40.0)

## 2024-04-21 LAB — VITAMIN D 25 HYDROXY (VIT D DEFICIENCY, FRACTURES): VITD: 43.21 ng/mL (ref 30.00–100.00)

## 2024-04-21 MED ORDER — LEVOTHYROXINE SODIUM 75 MCG PO TABS: 75.0000 ug | ORAL_TABLET | Freq: Every day | ORAL | 3 refills | Status: AC

## 2024-04-21 NOTE — Progress Notes (Signed)
 Hypothyroidism.  Compliant with replacement.  No neck mass, no dysphagia.  TSH pending.     H/o HLD.  Diet and exercise d/w pt.  Labs pending.    She is going to f/u with dermatology, dw pt.    She was seen by uro gyn and mybetriq became much more expensive, changed to AZO bladder control with relief.     Mammogram 2025 DXA declined.  She wouldn't want to go through treatment.  Tetanus 2009 , d/w pt about getting at pharmacy Shingles prev PNA prev done.  RSV prev done.  covid prev done Flu 2025 Advance directive- would have her husband then both of her kids designated if patient were incapacitated.   She isn't lightheaded.  BP at baseline.   Flu illness d/w pt.  She had been vaccinated.  She had cough after illness.  Her cough is better now.    Meds, vitals, and allergies reviewed.   ROS: Per HPI unless specifically indicated in ROS section   GEN: nad, alert and oriented HEENT: mucous membranes moist NECK: supple w/o LA CV: rrr PULM: ctab, no inc wob ABD: soft, +bs EXT: no edema SKIN: Well-perfused

## 2024-04-21 NOTE — Patient Instructions (Signed)
 Tetanus shot at pharmacy when feeling better.   Go to the lab on the way out.   If you have mychart we'll likely use that to update you.    Take care.  Glad to see you.

## 2024-04-24 ENCOUNTER — Ambulatory Visit: Payer: Self-pay | Admitting: Family Medicine

## 2024-04-24 NOTE — Assessment & Plan Note (Signed)
 TSH pending  Continue levothyroxine

## 2024-04-24 NOTE — Assessment & Plan Note (Signed)
 She was seen by uro gyn and mybetriq became much more expensive, changed to AZO bladder control with relief.   Continue as is.

## 2024-04-24 NOTE — Assessment & Plan Note (Signed)
 Mammogram 2025 DXA declined.  She wouldn't want to go through treatment.  Tetanus 2009 , d/w pt about getting at pharmacy Shingles prev PNA prev done.  RSV prev done.  covid prev done Flu 2025 Advance directive- would have her husband then both of her kids designated if patient were incapacitated.

## 2024-04-24 NOTE — Assessment & Plan Note (Signed)
 Advance directive- would have her husband then both of her kids designated if patient were incapacitated.

## 2024-04-24 NOTE — Assessment & Plan Note (Signed)
 History of, see notes on labs.  Continue work on diet and exercise.

## 2024-06-07 ENCOUNTER — Encounter: Admitting: Dermatology

## 2024-08-31 ENCOUNTER — Ambulatory Visit
